# Patient Record
Sex: Male | Born: 1970
Health system: Southern US, Community
[De-identification: ages and names within clinical notes are randomized; demographics above are authoritative.]

## PROBLEM LIST (undated history)

## (undated) DIAGNOSIS — G4730 Sleep apnea, unspecified: Secondary | ICD-10-CM

## (undated) DIAGNOSIS — R7989 Other specified abnormal findings of blood chemistry: Secondary | ICD-10-CM

## (undated) DIAGNOSIS — Z8739 Personal history of other diseases of the musculoskeletal system and connective tissue: Secondary | ICD-10-CM

## (undated) DIAGNOSIS — Z8489 Family history of other specified conditions: Secondary | ICD-10-CM

## (undated) HISTORY — DX: Personal history of other diseases of the musculoskeletal system and connective tissue: Z87.39

## (undated) HISTORY — DX: Sleep apnea, unspecified: G47.30

## (undated) HISTORY — DX: Other specified abnormal findings of blood chemistry: R79.89

## (undated) HISTORY — PX: NO PAST SURGERIES: SHX2092

## (undated) HISTORY — DX: Family history of other specified conditions: Z84.89

---

## 2005-12-07 ENCOUNTER — Emergency Department: Payer: Self-pay | Admitting: Emergency Medicine

## 2010-03-22 ENCOUNTER — Ambulatory Visit: Payer: Self-pay | Admitting: Family Medicine

## 2011-06-03 IMAGING — US US EXTREM LOW VENOUS*L*
1 series · 17 of 22 positions shown · non-contrast
Comparison: none

REASON FOR EXAM: CALL REPORT [DATE] rule out dvt
COMMENTS:

[Series 1: us extrem low venous*left* · 17 of 22 slices shown]
[im 1/22]
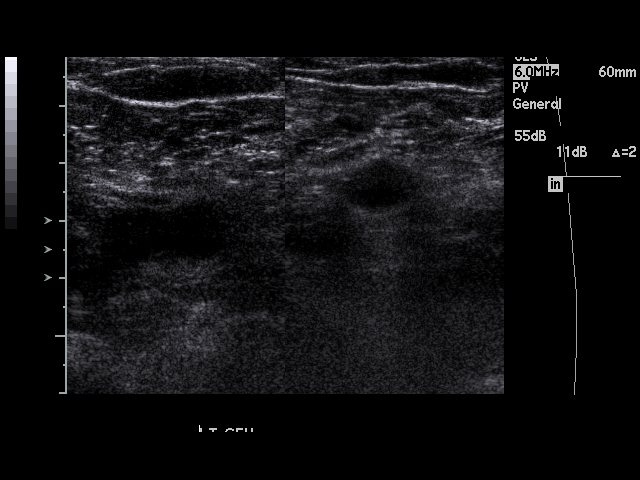
[im 2/22]
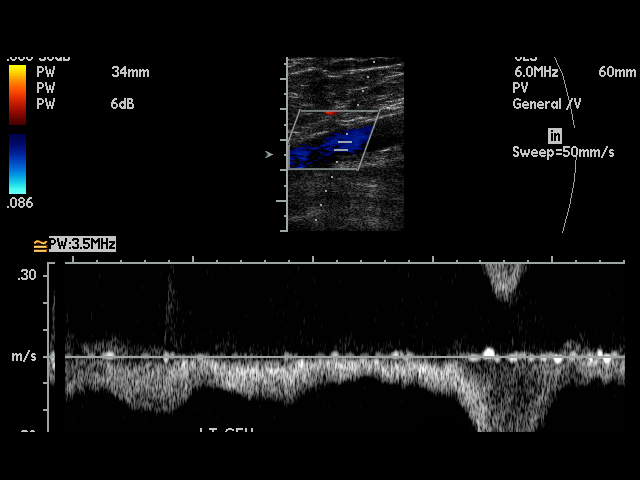
[im 4/22]
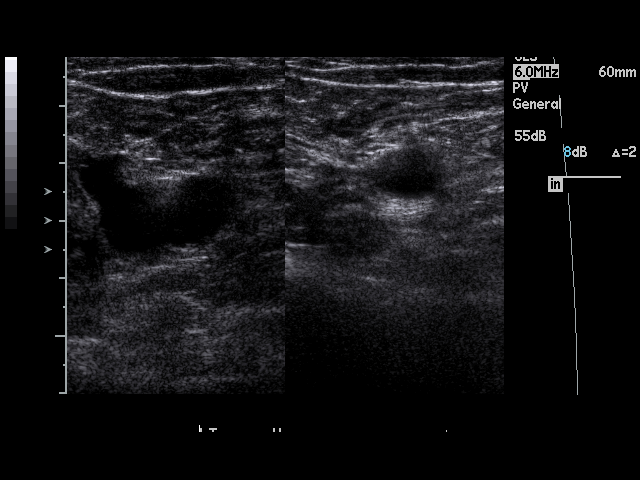
[im 5/22]
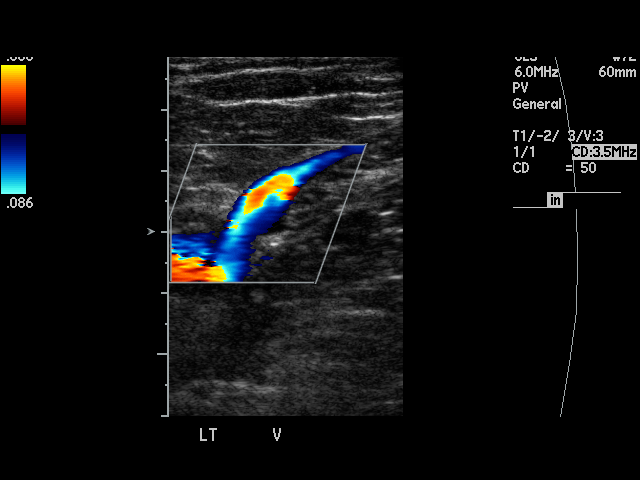
[im 6/22]
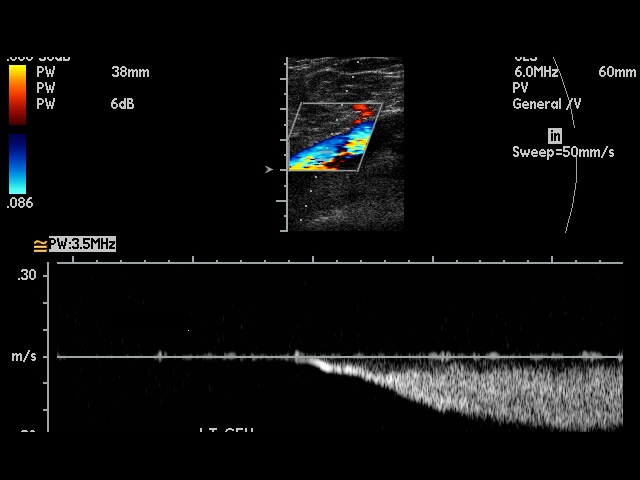
[im 8/22]
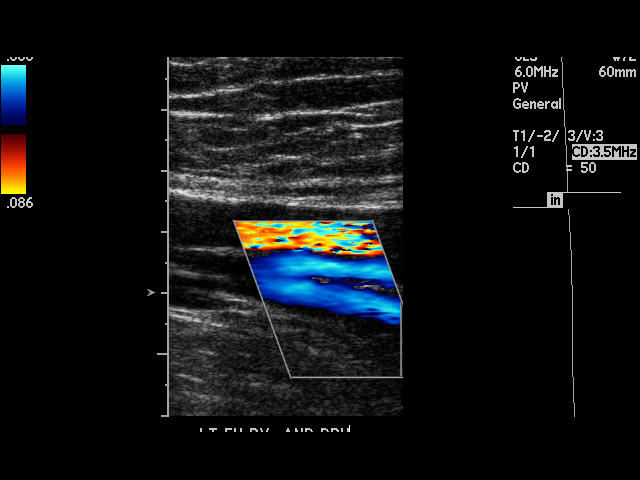
[im 9/22]
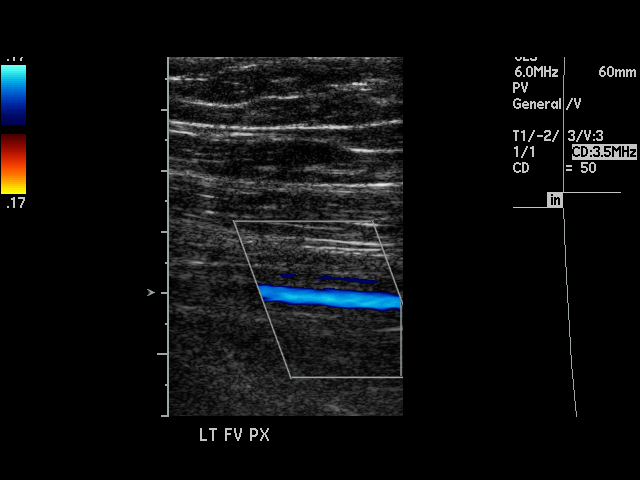
[im 10/22]
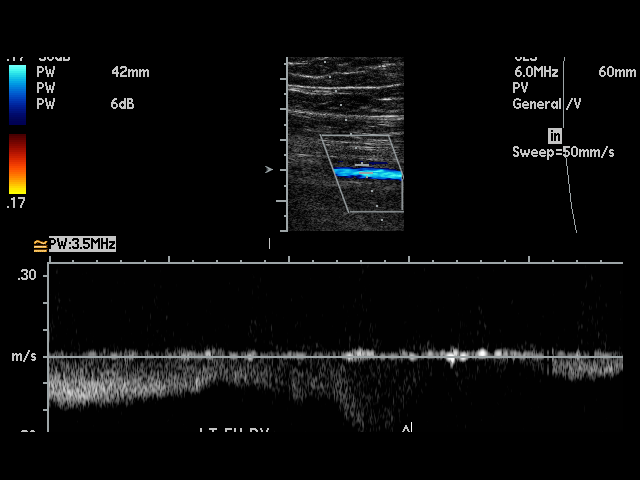
[im 12/22]
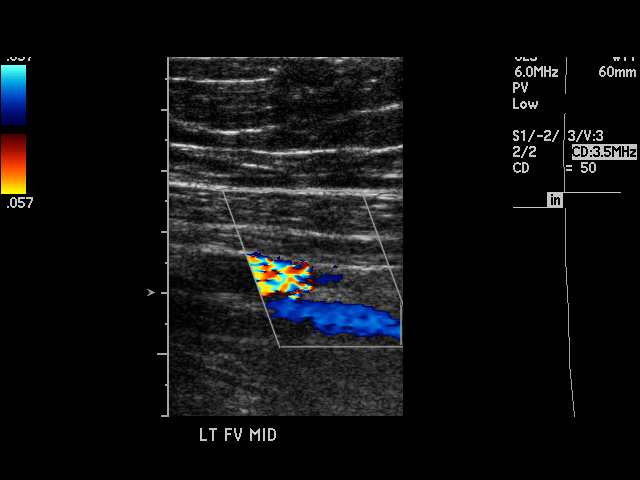
[im 13/22]
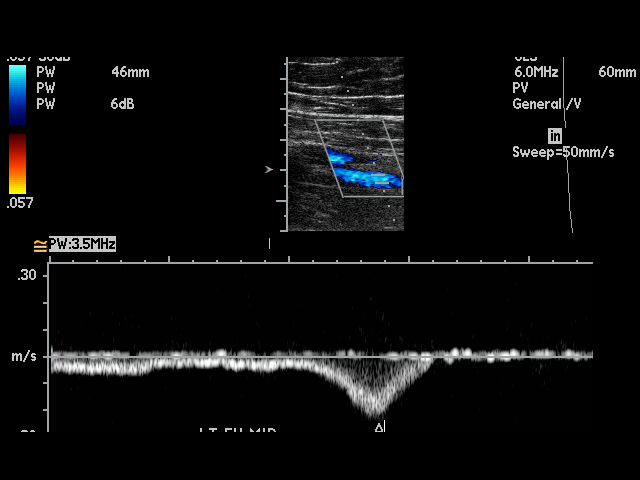
[im 14/22]
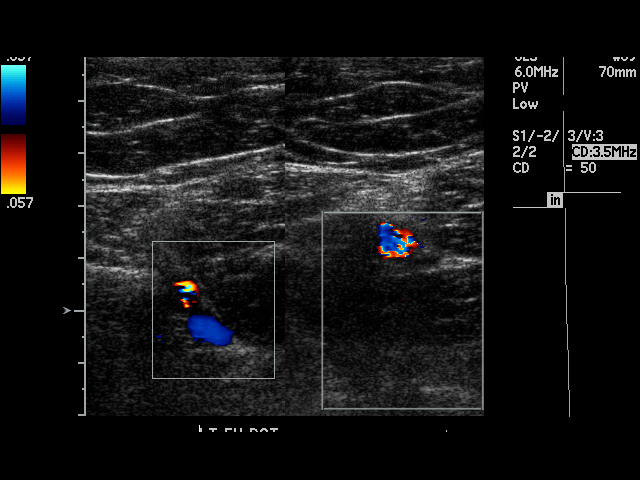
[im 15/22]
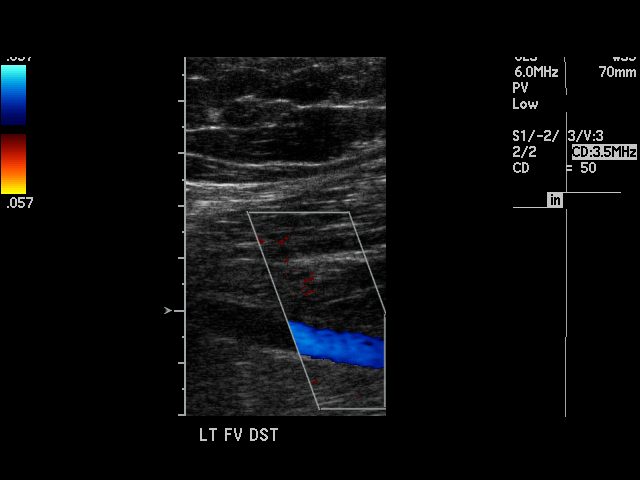
[im 17/22]
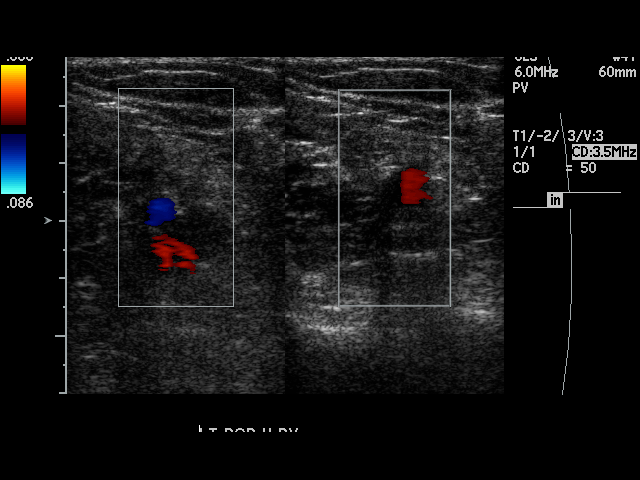
[im 18/22]
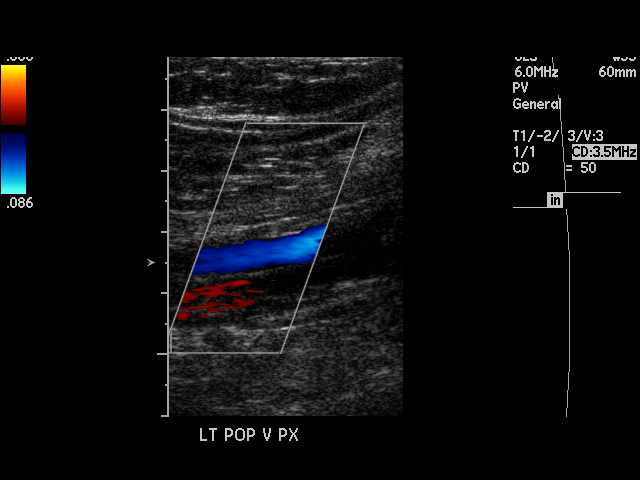
[im 19/22]
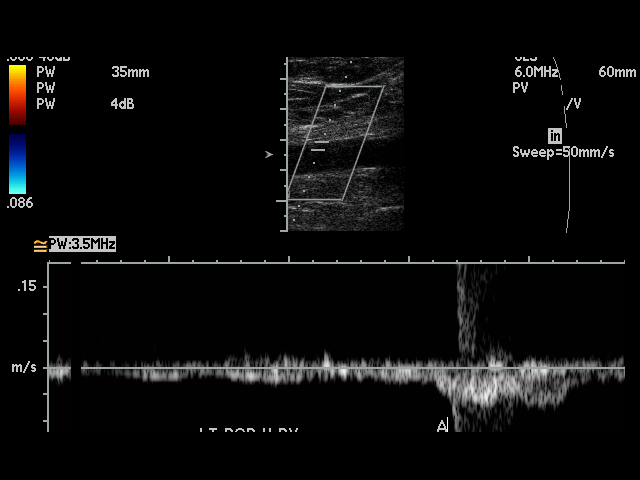
[im 21/22]
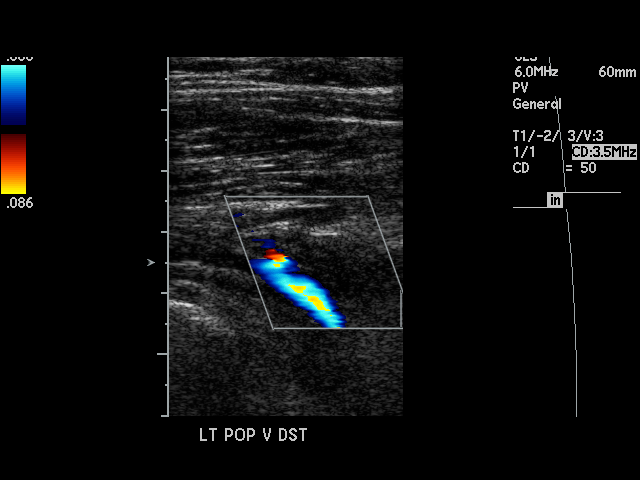
[im 22/22]
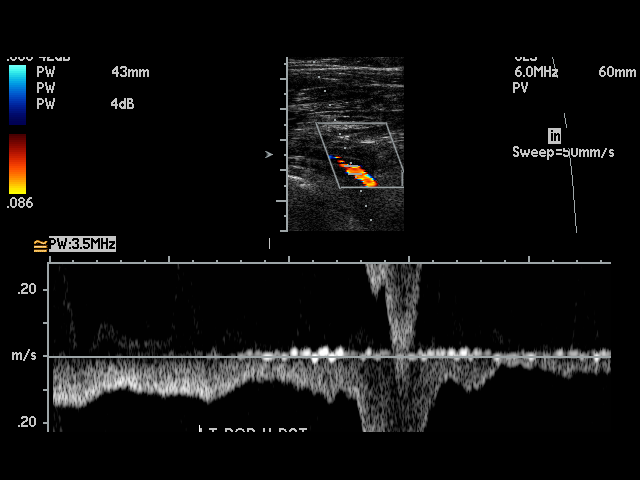

[17 of 22 positions shown; findings below may reference images not displayed]

PROCEDURE:     US  - US DOPPLER LOW EXTR LEFT  - March 22, 2010  [DATE]

RESULT:     Doppler interrogation of the deep venous system of the left leg
from the common femoral to the popliteal vein shows normal compressibility.
The color Doppler and spectral Doppler appearance is normal. There is
respiratory phasicity with increased proximal flow during calf compression.
IMPRESSION: No evidence of left lower extremity deep vein thrombosis.

## 2015-12-18 ENCOUNTER — Encounter: Payer: Self-pay | Admitting: Physician Assistant

## 2015-12-18 ENCOUNTER — Encounter: Payer: Self-pay | Admitting: Urology

## 2015-12-18 ENCOUNTER — Ambulatory Visit (INDEPENDENT_AMBULATORY_CARE_PROVIDER_SITE_OTHER): Payer: PRIVATE HEALTH INSURANCE | Admitting: Urology

## 2015-12-18 VITALS — BP 115/75 | HR 79 | Ht 76.0 in | Wt 195.6 lb

## 2015-12-18 DIAGNOSIS — N528 Other male erectile dysfunction: Secondary | ICD-10-CM

## 2015-12-18 DIAGNOSIS — E291 Testicular hypofunction: Secondary | ICD-10-CM

## 2015-12-18 DIAGNOSIS — N529 Male erectile dysfunction, unspecified: Secondary | ICD-10-CM

## 2015-12-18 NOTE — Progress Notes (Signed)
12/18/2015 3:41 PM   Robert Trevino [birth year] [id number]  Referring provider: No referring provider defined for this encounter.  Chief Complaint  Patient presents with  . Low Testosterone    referred by [name] Robert Trevino    HPI: Patient is a 45 year old Caucasian male with hypogonadism who had been receiving testosterone cypionate through his PCP's office who is referred to [place] to manage his hypogonadal issues.  He states he has been self administering testosterone since October 2016. At one point, he is found to have an elevated prolactin level and was referred to endocrinology. Endocrinology evaluated the patient and suggested he not return on to the testosterone injections.  He did not have an MRI of his pituitary gland.  Since he has been off his injections, he feels fatigued, depressed, experiencing reduced libido, moodiness, and the deterioration in his work performance.        Androgen Deficiency in the Aging Male      12/18/15 1500       Androgen Deficiency in the Aging Male   Do you have a decrease in libido (sex drive) Yes     Do you have lack of energy Yes     Do you have a decrease in strength and/or endurance Yes     Have you lost height No     Have you noticed a decreased "enjoyment of life" Yes     Are you sad and/or grumpy Yes     Are your erections less strong Yes     Have you noticed a recent deterioration in your ability to play sports Yes     Are you falling asleep after dinner No     Has there been a recent deterioration in your work performance Yes         Erectile dysfunction His SHIM score is 14, which is mild to moderate ED.   He has been having difficulty with erections for the last several months.   His major complaint is getting an erections.  His libido is diminished.   His risk factors for ED are sleep apnea (using CPAP machine) and hypogonadism .  He denies any painful erections or curvatures with his erections.         SHIM    12/18/15 1515       SHIM: Over the last 6 months:   How do you rate your confidence that you could get and keep an erection? Very Low     When you had erections with sexual stimulation, how often were your erections hard enough for penetration (entering your partner)? Sometimes (about half the time)     During sexual intercourse, how often were you able to maintain your erection after you had penetrated (entered) your partner? Difficult     During sexual intercourse, how difficult was it to maintain your erection to completion of intercourse? Slightly Difficult     When you attempted sexual intercourse, how often was it satisfactory for you? Difficult     SHIM Total Score   SHIM 14        Score: 1-7 Severe ED 8-11 Moderate ED 12-16 Mild-Moderate ED 17-21 Mild ED 22-25 No ED   He has not had any difficulty with urination at this time.  He denies gross hematuria, dysuria or suprapubic pain. He denies fevers, chills, nausea and vomiting.  PMH: Past Medical History  Diagnosis Date  . Sleep apnea   . Low testosterone     Surgical  History: Past Surgical History  Procedure Laterality Date  . None      Home Medications:    Medication List       This list is accurate as of: 12/18/15  3:41 PM.  Always use your most recent med list.               fluticasone 50 MCG/ACT nasal spray  Commonly known as:  FLONASE     testosterone cypionate 200 MG/ML injection  Commonly known as:  DEPOTESTOSTERONE CYPIONATE  Inject into the muscle. Reported on 12/18/2015        Allergies: No Known Allergies  Family History: Family History  Problem Relation Age of Onset  . Kidney disease Neg Hx   . Prostate cancer Neg Hx     Social History:  reports that he has never smoked. He does not have any smokeless tobacco history on file. He reports that he drinks alcohol. He reports that he does not use illicit drugs.  ROS: UROLOGY Frequent Urination?: No Hard to postpone urination?:  No Burning/pain with urination?: No Get up at night to urinate?: No Leakage of urine?: No Urine stream starts and stops?: No Trouble starting stream?: No Do you have to strain to urinate?: No Blood in urine?: No Urinary tract infection?: No Sexually transmitted disease?: No Injury to kidneys or bladder?: No Painful intercourse?: No Weak stream?: No Erection problems?: Yes Penile pain?: No  Gastrointestinal Nausea?: No Vomiting?: No Indigestion/heartburn?: No Diarrhea?: No Constipation?: No  Constitutional Fever: No Night sweats?: No Weight loss?: No Fatigue?: Yes  Skin Skin rash/lesions?: No Itching?: No  Eyes Blurred vision?: No Double vision?: No  Ears/Nose/Throat Sore throat?: No Sinus problems?: Yes  Hematologic/Lymphatic Swollen glands?: No Easy bruising?: No  Cardiovascular Leg swelling?: No Chest pain?: No  Respiratory Cough?: No Shortness of breath?: No  Endocrine Excessive thirst?: No  Musculoskeletal Back pain?: Yes Joint pain?: No  Neurological Headaches?: No Dizziness?: No  Psychologic Depression?: No Anxiety?: No  Physical Exam: BP 115/75 mmHg  Pulse 79  Ht  (1.93 m)  Constitutional: Well nourished. Alert and oriented, No acute distress. HEENT: [place] AT, moist mucus membranes. Trachea midline, no masses. Cardiovascular: No clubbing, cyanosis, or edema. Respiratory: Normal respiratory effort, no increased work of breathing. GI: Abdomen is soft, non tender, non distended, no abdominal masses. Liver and spleen not palpable.  No hernias appreciated.  Stool sample for occult testing is not indicated.   GU: No CVA tenderness.  No bladder fullness or masses.  Patient with circumcised phallus.   Urethral meatus is patent.  No penile discharge. No penile lesions or rashes. Scrotum without lesions, cysts, rashes and/or edema.  Testicles are located scrotally bilaterally. No masses are appreciated in the testicles. Left and right  epididymis are normal. Rectal: Patient with  normal sphincter tone. Anus and perineum without scarring or rashes. No rectal masses are appreciated. Prostate is approximately 45 grams, no nodules are appreciated. Seminal vesicles are normal. Skin: No rashes, bruises or suspicious lesions. Lymph: No cervical or inguinal adenopathy. Neurologic: Grossly intact, no focal deficits, moving all 4 extremities. Psychiatric: Normal mood and affect.   Assessment & Plan:    1. Hypogonadism:   I explained to patient that we would need 2 morning serum testosterone draws before 9 AM on 2 separate occasions returned below the laboratories parameter for normal testosterone. I have added TSH, PSA, lipids, CMP, HCT, FSH/LH and prolactin to his PCP's blood work request.  He will sign a  medical release, so that I can obtain those values.    I discussed with the patient the side effects of testosterone therapy, such as: enlargement of the prostate gland that may in turn cause LUTS, possible increased risk of PCa, DVT's and/or PE's, possible increased risk of heart attack or stroke, lower sperm count, swelling of the ankles, feet, or body, with or without heart failure, enlarged or painful breasts, have problems breathing while you sleep (sleep apnea), increased prostate specific antigen, mood swings, hypertension and increased red blood cell count.  If his testosterone levels are found low and his other blood work returns within normal parameters, he would like to restart the injections.  I did explain that our office policy is to have patient's receive the injections in our office due to the potential of abuse with testosterone products.    2. Erectile dysfunction:   SHIM score is 14.  We will readdress once the patient's gonadal state is evaluated.    Greater than 50% was spent in counseling & coordination of care with the patient.   Return for serum testosterone before 9 AM  .  These notes generated with voice  recognition software. I apologize for typographical errors.  Michiel Cowboy, PA-C  [facility] [address] [place], [place] [address] [phone]

## 2015-12-19 DIAGNOSIS — E291 Testicular hypofunction: Secondary | ICD-10-CM | POA: Insufficient documentation

## 2015-12-20 ENCOUNTER — Other Ambulatory Visit: Payer: Self-pay

## 2015-12-20 DIAGNOSIS — Z79899 Other long term (current) drug therapy: Secondary | ICD-10-CM

## 2015-12-20 DIAGNOSIS — E291 Testicular hypofunction: Secondary | ICD-10-CM

## 2015-12-20 DIAGNOSIS — N529 Male erectile dysfunction, unspecified: Secondary | ICD-10-CM | POA: Insufficient documentation

## 2015-12-21 ENCOUNTER — Encounter: Payer: Self-pay | Admitting: Physician Assistant

## 2015-12-21 ENCOUNTER — Other Ambulatory Visit: Payer: PRIVATE HEALTH INSURANCE

## 2015-12-21 DIAGNOSIS — E291 Testicular hypofunction: Secondary | ICD-10-CM

## 2015-12-22 LAB — TESTOSTERONE: Testosterone: 276 ng/dL — ABNORMAL LOW (ref 348–1197)

## 2015-12-24 ENCOUNTER — Other Ambulatory Visit: Payer: Self-pay

## 2015-12-24 ENCOUNTER — Ambulatory Visit: Payer: Self-pay | Admitting: Urology

## 2015-12-24 ENCOUNTER — Telehealth: Payer: Self-pay

## 2015-12-24 DIAGNOSIS — E291 Testicular hypofunction: Secondary | ICD-10-CM

## 2015-12-24 NOTE — Telephone Encounter (Signed)
Spoke with Dianna at [facility] and test were added.

## 2015-12-24 NOTE — Telephone Encounter (Signed)
-----   Message from Harle Battiest, PA-C sent at 12/22/2015  7:45 PM EST ----- Please add an estradiol level to his blood work.

## 2015-12-25 ENCOUNTER — Other Ambulatory Visit: Payer: PRIVATE HEALTH INSURANCE

## 2015-12-25 DIAGNOSIS — E291 Testicular hypofunction: Secondary | ICD-10-CM

## 2015-12-25 LAB — SPECIMEN STATUS REPORT

## 2015-12-25 LAB — ESTRADIOL: Estradiol: 21.5 pg/mL (ref 7.6–42.6)

## 2015-12-26 ENCOUNTER — Telehealth: Payer: Self-pay

## 2015-12-26 DIAGNOSIS — E291 Testicular hypofunction: Secondary | ICD-10-CM

## 2015-12-26 LAB — TESTOSTERONE: TESTOSTERONE: 514 ng/dL (ref 348–1197)

## 2015-12-26 NOTE — Telephone Encounter (Signed)
Spoke with pt in reference to testosterone results. Pt stated that no he has not given himself an injection. Please advise.

## 2015-12-26 NOTE — Telephone Encounter (Signed)
Spoke with pt in reference testosterone levels. Pt asked if we could check the levels again to rule out error. Pleas advise.

## 2015-12-26 NOTE — Telephone Encounter (Signed)
That is fine 

## 2015-12-26 NOTE — Telephone Encounter (Signed)
His last testosterone return at 500.  This is a normal testosterone level.  By this result, he does not have hypogonadism.

## 2015-12-26 NOTE — Telephone Encounter (Signed)
-----   Message from Harle Battiest, PA-C sent at 12/26/2015  8:49 AM EST ----- Patient's testosterone is normal.  Did he inject himself?

## 2015-12-26 NOTE — Telephone Encounter (Signed)
LMOM

## 2015-12-27 NOTE — Telephone Encounter (Signed)
Spoke with pt and made aware Carollee Herter said we can do another testosterone lab draw. Pt will RTC tomorrow morning. Lab orders placed.

## 2015-12-28 ENCOUNTER — Other Ambulatory Visit: Payer: PRIVATE HEALTH INSURANCE

## 2015-12-28 DIAGNOSIS — E291 Testicular hypofunction: Secondary | ICD-10-CM

## 2015-12-29 LAB — TESTOSTERONE: TESTOSTERONE: 258 ng/dL — AB (ref 348–1197)

## 2015-12-31 ENCOUNTER — Telehealth: Payer: Self-pay

## 2015-12-31 DIAGNOSIS — E291 Testicular hypofunction: Secondary | ICD-10-CM

## 2015-12-31 NOTE — Telephone Encounter (Signed)
-----   Message from Harle Battiest, PA-C sent at 12/31/2015 12:37 PM EST ----- I would like one more testosterone level in the am to be sure.

## 2015-12-31 NOTE — Telephone Encounter (Signed)
Spoke with pt in reference to testosterone levels. Pt agreed to another testosterone lab draw. Pt will call back to make lab appt. Orders placed.

## 2016-01-09 ENCOUNTER — Other Ambulatory Visit: Payer: PRIVATE HEALTH INSURANCE

## 2016-01-09 DIAGNOSIS — E291 Testicular hypofunction: Secondary | ICD-10-CM

## 2016-01-10 LAB — TESTOSTERONE: Testosterone: 282 ng/dL — ABNORMAL LOW (ref 348–1197)

## 2016-01-15 ENCOUNTER — Telehealth: Payer: Self-pay

## 2016-01-15 NOTE — Telephone Encounter (Signed)
-----   Message from Harle Battiest, PA-C sent at 01/10/2016  8:34 AM EST ----- Patient may start testosterone cypionate /mL, 1 mL every two weeks.  I would then like a testosterone level drawn with HCT in three months, one week after an injection.

## 2016-01-15 NOTE — Telephone Encounter (Signed)
LMOM-medication sent to pharmacy 

## 2016-01-15 NOTE — Telephone Encounter (Signed)
Spoke with pt in reference to testosterone injections. Pt will pick up medication and come Thursday for injection. On Thursday lab appt will be made.

## 2016-01-16 ENCOUNTER — Other Ambulatory Visit: Payer: Self-pay

## 2016-01-16 DIAGNOSIS — E291 Testicular hypofunction: Secondary | ICD-10-CM

## 2016-01-16 MED ORDER — TESTOSTERONE CYPIONATE 200 MG/ML IM SOLN: 200.0000 mg | INTRAMUSCULAR | Status: DC

## 2016-01-17 ENCOUNTER — Ambulatory Visit (INDEPENDENT_AMBULATORY_CARE_PROVIDER_SITE_OTHER): Payer: PRIVATE HEALTH INSURANCE

## 2016-01-17 DIAGNOSIS — E291 Testicular hypofunction: Secondary | ICD-10-CM | POA: Diagnosis not present

## 2016-01-17 MED ORDER — TESTOSTERONE CYPIONATE 200 MG/ML IM SOLN
200.0000 mg | Freq: Once | INTRAMUSCULAR | Status: AC
  Administered 2016-01-17: 200 mg via INTRAMUSCULAR

## 2016-01-17 NOTE — Progress Notes (Signed)
Testosterone IM Injection  Due to Hypogonadism patient is present today for a Testosterone Injection.  Medication: Testosterone Cypionate Dose: 1mL Location: right upper outer buttocks Lot: [id number] Exp:04/2017  Patient tolerated well, no complications were noted  Preformed by: Rupert Stacks, LPN   Follow up: 2 weeks

## 2016-01-31 ENCOUNTER — Ambulatory Visit: Payer: PRIVATE HEALTH INSURANCE

## 2016-01-31 ENCOUNTER — Ambulatory Visit (INDEPENDENT_AMBULATORY_CARE_PROVIDER_SITE_OTHER): Payer: PRIVATE HEALTH INSURANCE | Admitting: *Deleted

## 2016-01-31 DIAGNOSIS — E291 Testicular hypofunction: Secondary | ICD-10-CM

## 2016-01-31 MED ORDER — TESTOSTERONE CYPIONATE 200 MG/ML IM SOLN
200.0000 mg | Freq: Once | INTRAMUSCULAR | Status: AC
  Administered 2016-01-31: 200 mg via INTRAMUSCULAR

## 2016-01-31 NOTE — Progress Notes (Signed)
Testosterone IM Injection  Due to Hypogonadism patient is present today for a Testosterone Injection.  Medication: Testosterone Cypionate Dose: 1ml Location: left upper outer buttocks Lot: [id number] Exp:04/2017  Patient tolerated well, no complications were noted  Preformed by: Dallas Schimkeamona Souleymane Saiki CMA  Follow up: 2 weeks

## 2016-02-14 ENCOUNTER — Ambulatory Visit (INDEPENDENT_AMBULATORY_CARE_PROVIDER_SITE_OTHER): Payer: PRIVATE HEALTH INSURANCE

## 2016-02-14 DIAGNOSIS — E291 Testicular hypofunction: Secondary | ICD-10-CM | POA: Diagnosis not present

## 2016-02-14 MED ORDER — TESTOSTERONE CYPIONATE 200 MG/ML IM SOLN
200.0000 mg | Freq: Once | INTRAMUSCULAR | Status: AC
  Administered 2016-02-14: 200 mg via INTRAMUSCULAR

## 2016-02-14 NOTE — Progress Notes (Signed)
Testosterone IM Injection  Due to Hypogonadism patient is present today for a Testosterone Injection.  Medication: Testosterone Cypionate Dose: 1mL Location: right upper outer buttocks Lot: [id number] Exp:07/2017  Patient tolerated well, no complications were noted  Preformed by: Rupert Stackshelsea Watkins, LPN

## 2016-02-27 ENCOUNTER — Ambulatory Visit (INDEPENDENT_AMBULATORY_CARE_PROVIDER_SITE_OTHER): Payer: PRIVATE HEALTH INSURANCE

## 2016-02-27 DIAGNOSIS — E291 Testicular hypofunction: Secondary | ICD-10-CM | POA: Diagnosis not present

## 2016-02-27 MED ORDER — TESTOSTERONE CYPIONATE 200 MG/ML IM SOLN
200.0000 mg | Freq: Once | INTRAMUSCULAR | Status: AC
  Administered 2016-02-27: 200 mg via INTRAMUSCULAR

## 2016-02-27 NOTE — Progress Notes (Signed)
Testosterone IM Injection  Due to Hypogonadism patient is present today for a Testosterone Injection.  Medication: Testosterone Cypionate Dose: 1mL Location: left upper outer buttocks Lot: [id number] Exp:07/2017  Patient tolerated well, no complications were noted  Preformed by: Rupert Stackshelsea Watkins, LPN   Follow up: 2 weeks

## 2016-02-28 ENCOUNTER — Ambulatory Visit: Payer: PRIVATE HEALTH INSURANCE

## 2016-03-09 ENCOUNTER — Telehealth: Payer: Self-pay | Admitting: Urology

## 2016-03-09 ENCOUNTER — Other Ambulatory Visit: Payer: Self-pay | Admitting: Urology

## 2016-03-09 NOTE — Telephone Encounter (Signed)
I would like a serum testosterone level checked 1 week after his next testosterone cypionate injection on the 04/27.

## 2016-03-13 ENCOUNTER — Ambulatory Visit (INDEPENDENT_AMBULATORY_CARE_PROVIDER_SITE_OTHER): Payer: PRIVATE HEALTH INSURANCE

## 2016-03-13 DIAGNOSIS — E291 Testicular hypofunction: Secondary | ICD-10-CM | POA: Diagnosis not present

## 2016-03-13 MED ORDER — TESTOSTERONE CYPIONATE 200 MG/ML IM SOLN
200.0000 mg | Freq: Once | INTRAMUSCULAR | Status: AC
  Administered 2016-03-13: 200 mg via INTRAMUSCULAR

## 2016-03-13 NOTE — Progress Notes (Signed)
Testosterone IM Injection  Due to Hypogonadism patient is present today for a Testosterone Injection.  Medication: Testosterone Cypionate Dose: 1mL Location: right upper outer buttocks Lot: [id number] Exp:08/2017  Patient tolerated well, no complications were noted  Preformed by: Rupert Stackshelsea Meade [place]eland, LPN   Follow up: 2 weeks

## 2016-03-14 ENCOUNTER — Telehealth: Payer: Self-pay | Admitting: Urology

## 2016-03-14 NOTE — Telephone Encounter (Signed)
LMOM- needs labs next Thursday

## 2016-03-14 NOTE — Telephone Encounter (Signed)
Pt called saying his insurance carrier won't cover any of his labs due to them being coded incorrectly. He was told by his insurance carrier that the reason for the labs is "infertility" and he said that's incorrect. He wants to make sure the lab codes are changed and re-filed with his insurance. Please give him a call regarding this.  Pt's ph# [phone] Thank you.

## 2016-03-19 ENCOUNTER — Other Ambulatory Visit: Payer: Self-pay

## 2016-03-19 DIAGNOSIS — E291 Testicular hypofunction: Secondary | ICD-10-CM

## 2016-03-20 ENCOUNTER — Other Ambulatory Visit: Payer: PRIVATE HEALTH INSURANCE

## 2016-03-20 DIAGNOSIS — E291 Testicular hypofunction: Secondary | ICD-10-CM

## 2016-03-21 ENCOUNTER — Telehealth: Payer: Self-pay

## 2016-03-21 LAB — TESTOSTERONE: Testosterone: 617 ng/dL (ref 348–1197)

## 2016-03-21 LAB — HEMATOCRIT

## 2016-03-21 NOTE — Telephone Encounter (Signed)
Spoke with pt in reference to needing an office visit. Pt June lab appt was changed to an office visit.

## 2016-03-21 NOTE — Telephone Encounter (Signed)
-----   Message from Harle BattiestShannon A McGowan, PA-C sent at 03/21/2016  8:15 AM EDT ----- Patient needs an office visit with me in June.

## 2016-03-27 ENCOUNTER — Ambulatory Visit (INDEPENDENT_AMBULATORY_CARE_PROVIDER_SITE_OTHER): Payer: PRIVATE HEALTH INSURANCE

## 2016-03-27 DIAGNOSIS — E291 Testicular hypofunction: Secondary | ICD-10-CM | POA: Diagnosis not present

## 2016-03-27 MED ORDER — TESTOSTERONE CYPIONATE 200 MG/ML IM SOLN
200.0000 mg | Freq: Once | INTRAMUSCULAR | Status: AC
Start: 2016-03-27 — End: 2016-03-27
  Administered 2016-03-27: 200 mg via INTRAMUSCULAR

## 2016-03-27 NOTE — Progress Notes (Signed)
Testosterone IM Injection  Due to Hypogonadism patient is present today for a Testosterone Injection.  Medication: Testosterone Cypionate Dose: 1mL Location: left upper outer buttocks Lot: [id number] Exp:08/2017  Patient tolerated well, no complications were noted  Preformed by: Rupert Stackshelsea Lynesha Bango, LPN

## 2016-04-03 ENCOUNTER — Other Ambulatory Visit: Payer: Self-pay | Admitting: Urology

## 2016-04-08 ENCOUNTER — Ambulatory Visit (INDEPENDENT_AMBULATORY_CARE_PROVIDER_SITE_OTHER): Payer: PRIVATE HEALTH INSURANCE

## 2016-04-08 DIAGNOSIS — E291 Testicular hypofunction: Secondary | ICD-10-CM

## 2016-04-08 MED ORDER — TESTOSTERONE CYPIONATE 200 MG/ML IM SOLN
200.0000 mg | Freq: Once | INTRAMUSCULAR | Status: AC
  Administered 2016-04-08: 200 mg via INTRAMUSCULAR

## 2016-04-08 NOTE — Progress Notes (Signed)
Testosterone IM Injection  Due to Hypogonadism patient is present today for a Testosterone Injection.  Medication: Testosterone Cypionate Dose: 1mL Location: right upper outer buttocks Lot: [id number] Exp:10/2017  Patient tolerated well, no complications were noted  Preformed by: Rupert Stackshelsea Jaiquan Temme, LPN

## 2016-04-24 ENCOUNTER — Ambulatory Visit (INDEPENDENT_AMBULATORY_CARE_PROVIDER_SITE_OTHER): Payer: PRIVATE HEALTH INSURANCE

## 2016-04-24 DIAGNOSIS — E291 Testicular hypofunction: Secondary | ICD-10-CM | POA: Diagnosis not present

## 2016-04-24 MED ORDER — TESTOSTERONE CYPIONATE 200 MG/ML IM SOLN
200.0000 mg | Freq: Once | INTRAMUSCULAR | Status: AC
  Administered 2016-04-24: 200 mg via INTRAMUSCULAR

## 2016-04-24 NOTE — Progress Notes (Signed)
Testosterone IM Injection  Due to Hypogonadism patient is present today for a Testosterone Injection.  Medication: Testosterone Cypionate Dose: 1mL Location: left upper outer buttocks Lot: [id number] Exp:10/2017  Patient tolerated well, no complications were noted  Preformed by: Herma Uballe, LPN     

## 2016-05-01 ENCOUNTER — Ambulatory Visit (INDEPENDENT_AMBULATORY_CARE_PROVIDER_SITE_OTHER): Payer: PRIVATE HEALTH INSURANCE | Admitting: Urology

## 2016-05-01 ENCOUNTER — Encounter: Payer: Self-pay | Admitting: Urology

## 2016-05-01 ENCOUNTER — Other Ambulatory Visit: Payer: PRIVATE HEALTH INSURANCE

## 2016-05-01 VITALS — BP 129/73 | HR 76 | Ht 76.0 in | Wt 290.0 lb

## 2016-05-01 DIAGNOSIS — N528 Other male erectile dysfunction: Secondary | ICD-10-CM | POA: Diagnosis not present

## 2016-05-01 DIAGNOSIS — E291 Testicular hypofunction: Secondary | ICD-10-CM

## 2016-05-01 DIAGNOSIS — N529 Male erectile dysfunction, unspecified: Secondary | ICD-10-CM

## 2016-05-01 DIAGNOSIS — N401 Enlarged prostate with lower urinary tract symptoms: Secondary | ICD-10-CM | POA: Diagnosis not present

## 2016-05-01 DIAGNOSIS — N138 Other obstructive and reflux uropathy: Secondary | ICD-10-CM

## 2016-05-01 NOTE — Progress Notes (Signed)
4:32 PM   LYTLE MALBURG Jul 22, 1971 [id number]  Referring provider: Geoffery Lyons, PA [address] [place], [place] [address]  Chief Complaint  Patient presents with  . Hypogonadism    HPI: Patient is a 45 year old Caucasian male with hypogonadism and erectile dysfunction who presents today for a 6 months office visit.    Hypogonadism Patient is not experiencing a decrease in libido, a lack of energy, a decrease in strength, a loss in height, a decreased enjoyment in life, sadness and/or grumpiness, erections being less strong, a recent deterioration in an ability to play sports, falling asleep after dinner and a recent deterioration in their work performance.   This is indicated by his responses to the ADAM questionnaire.  He is currently managing his hypogonadism with testosterone cypionate 200 mg IM every two weeks.   His most recent testosterone was 617 ng/dL on 24/40/1027.        Androgen Deficiency in the Aging Male      05/01/16 1600       Androgen Deficiency in the Aging Male   Do you have a decrease in libido (sex drive) No     Do you have lack of energy No     Do you have a decrease in strength and/or endurance No     Have you lost height No     Have you noticed a decreased "enjoyment of life" No     Are you sad and/or grumpy Yes     Are your erections less strong No     Have you noticed a recent deterioration in your ability to play sports No     Are you falling asleep after dinner No     Has there been a recent deterioration in your work performance No        Erectile dysfunction His SHIM score is 22, which is mild to no ED.   He has been having difficulty with erections for the last several months.   His major complaint is getting an erections.  His libido is improved.   His risk factors for ED are sleep apnea (using CPAP machine) and hypogonadism .  He denies any painful erections or curvatures with his erections.         SHIM      05/01/16 1620       SHIM: Over the last 6 months:   How do you rate your confidence that you could get and keep an erection? High     When you had erections with sexual stimulation, how often were your erections hard enough for penetration (entering your partner)? Most Times (much more than half the time)     During sexual intercourse, how often were you able to maintain your erection after you had penetrated (entered) your partner? Slightly Difficult     During sexual intercourse, how difficult was it to maintain your erection to completion of intercourse? Not Difficult     When you attempted sexual intercourse, how often was it satisfactory for you? Not Difficult     SHIM Total Score   SHIM 22        Score: 1-7 Severe ED 8-11 Moderate ED 12-16 Mild-Moderate ED 17-21 Mild ED 22-25 No ED   BPH WITH LUTS His IPSS score today is 1, which is mild lower urinary tract symptomatology. He is pleased with his quality life due to his urinary symptoms.  He denies any dysuria, hematuria or suprapubic pain.   He  also denies any recent fevers, chills, nausea or vomiting.  He does not have a family history of PCa.      IPSS      05/01/16 1600       International Prostate Symptom Score   How often have you had the sensation of not emptying your bladder? Not at All     How often have you had to urinate less than every two hours? Not at All     How often have you found you stopped and started again several times when you urinated? Not at All     How often have you found it difficult to postpone urination? Not at All     How often have you had a weak urinary stream? Not at All     How often have you had to strain to start urination? Not at All     How many times did you typically get up at night to urinate? 1 Time     Total IPSS Score 1     Quality of Life due to urinary symptoms   If you were to spend the rest of your life with your urinary condition just the way it is now how would you feel about that? Pleased          Score:  1-7 Mild 8-19 Moderate 20-35 Severe   PMH: Past Medical History  Diagnosis Date  . Sleep apnea   . Low testosterone     Surgical History: Past Surgical History  Procedure Laterality Date  . None      Home Medications:    Medication List       This list is accurate as of: 05/01/16  4:32 PM.  Always use your most recent med list.               fluticasone 50 MCG/ACT nasal spray  Commonly known as:  FLONASE     testosterone cypionate 200 MG/ML injection  Commonly known as:  DEPOTESTOSTERONE CYPIONATE  INJECT 1 MILLILITER INTO THE MUSCLE EVERY 14 DAYS        Allergies: No Known Allergies  Family History: Family History  Problem Relation Age of Onset  . Kidney disease Neg Hx   . Prostate cancer Neg Hx     Social History:  reports that he has never smoked. He does not have any smokeless tobacco history on file. He reports that he drinks alcohol. He reports that he does not use illicit drugs.  ROS: UROLOGY Frequent Urination?: No Hard to postpone urination?: No Burning/pain with urination?: No Get up at night to urinate?: No Leakage of urine?: No Urine stream starts and stops?: No Trouble starting stream?: No Do you have to strain to urinate?: No Blood in urine?: No Urinary tract infection?: No Sexually transmitted disease?: No Injury to kidneys or bladder?: No Painful intercourse?: No Weak stream?: No Erection problems?: No Penile pain?: No  Gastrointestinal Nausea?: No Vomiting?: No Indigestion/heartburn?: No Diarrhea?: No Constipation?: No  Constitutional Fever: No Night sweats?: No Weight loss?: No Fatigue?: No  Skin Skin rash/lesions?: No Itching?: No  Eyes Blurred vision?: No Double vision?: No  Ears/Nose/Throat Sore throat?: No Sinus problems?: No  Hematologic/Lymphatic Swollen glands?: No Easy bruising?: No  Cardiovascular Leg swelling?: No Chest pain?: No  Respiratory Cough?: No Shortness of  breath?: No  Endocrine Excessive thirst?: No  Musculoskeletal Back pain?: Yes Joint pain?: No  Neurological Headaches?: No Dizziness?: No  Psychologic Depression?: No Anxiety?: No  Physical  Exam: BP 129/73 mmHg  Pulse 76  Ht  (1.93 m)  Wt 290 lb (131.543 kg)  BMI 35.31 kg/m2  Constitutional: Well nourished. Alert and oriented, No acute distress. HEENT: [place] AT, moist mucus membranes. Trachea midline, no masses. Cardiovascular: No clubbing, cyanosis, or edema. Respiratory: Normal respiratory effort, no increased work of breathing. GI: Abdomen is soft, non tender, non distended, no abdominal masses. Liver and spleen not palpable.  No hernias appreciated.  Stool sample for occult testing is not indicated.   GU: No CVA tenderness.  No bladder fullness or masses.  Patient with circumcised phallus.   Urethral meatus is patent.  No penile discharge. No penile lesions or rashes. Scrotum without lesions, cysts, rashes and/or edema.  Testicles are located scrotally bilaterally. No masses are appreciated in the testicles. Left and right epididymis are normal. Rectal: Patient with  normal sphincter tone. Anus and perineum without scarring or rashes. No rectal masses are appreciated. Prostate is approximately 45 grams, no nodules are appreciated. Seminal vesicles are normal. Skin: No rashes, bruises or suspicious lesions. Lymph: No cervical or inguinal adenopathy. Neurologic: Grossly intact, no focal deficits, moving all 4 extremities. Psychiatric: Normal mood and affect.   Assessment & Plan:    1. Hypogonadism:   Patient is doing well with the testosterone cypionate injections.  He would like to continue.  HCT is drawn today.  He will RTC next week for his injection.  He will need a HCT and a serum testosterone level drawn one week after an injection in three months.  He will need to see a provider in 6 months for ADAM, HCT and testosterone.  2. Erectile dysfunction:   SHIM score is  22.  We will continue to monitor.  He will RTC in 6 months for SHIM score and exam.  3. IPSS score is 1/1.  We will continue to monitor, as testosterone therapy can worsen LUTS.    He will have his IPSS score, exam and PSA in 6 months.   Return in about 1 week (around 05/08/2016) for moving shots to Wednesday.  These notes generated with voice recognition software. I apologize for typographical errors.  Michiel Cowboy, PA-C  [facility] [address] [place], [place] [address] [phone]

## 2016-05-02 ENCOUNTER — Telehealth: Payer: Self-pay

## 2016-05-02 LAB — HEMATOCRIT: HEMATOCRIT: 46.4 % (ref 37.5–51.0)

## 2016-05-02 LAB — PSA: PROSTATE SPECIFIC AG, SERUM: 0.7 ng/mL (ref 0.0–4.0)

## 2016-05-02 NOTE — Telephone Encounter (Signed)
LMOM- most recent labs are good.

## 2016-05-02 NOTE — Telephone Encounter (Signed)
-----   Message from Harle BattiestShannon A McGowan, PA-C sent at 05/02/2016  7:54 AM EDT ----- Labs are good.

## 2016-05-07 ENCOUNTER — Ambulatory Visit (INDEPENDENT_AMBULATORY_CARE_PROVIDER_SITE_OTHER): Payer: PRIVATE HEALTH INSURANCE

## 2016-05-07 DIAGNOSIS — E291 Testicular hypofunction: Secondary | ICD-10-CM | POA: Diagnosis not present

## 2016-05-07 MED ORDER — TESTOSTERONE CYPIONATE 200 MG/ML IM SOLN
200.0000 mg | Freq: Once | INTRAMUSCULAR | Status: AC
  Administered 2016-05-07: 200 mg via INTRAMUSCULAR

## 2016-05-07 NOTE — Progress Notes (Signed)
Testosterone IM Injection  Due to Hypogonadism patient is present today for a Testosterone Injection.  Medication: Testosterone Cypionate Dose: 1mL Location: right upper outer buttocks Lot: [id number] Exp:10/2017  Patient tolerated well, no complications were noted  Preformed by: Rupert Stackshelsea Nashaun Hillmer, LPN

## 2016-05-08 ENCOUNTER — Ambulatory Visit: Payer: PRIVATE HEALTH INSURANCE

## 2016-05-21 ENCOUNTER — Ambulatory Visit (INDEPENDENT_AMBULATORY_CARE_PROVIDER_SITE_OTHER): Payer: PRIVATE HEALTH INSURANCE

## 2016-05-21 DIAGNOSIS — E291 Testicular hypofunction: Secondary | ICD-10-CM | POA: Diagnosis not present

## 2016-05-21 MED ORDER — TESTOSTERONE CYPIONATE 200 MG/ML IM SOLN
200.0000 mg | Freq: Once | INTRAMUSCULAR | Status: AC
  Administered 2016-05-21: 200 mg via INTRAMUSCULAR

## 2016-05-21 NOTE — Progress Notes (Signed)
Testosterone IM Injection  Due to Hypogonadism patient is present today for a Testosterone Injection.  Medication: Testosterone Cypionate Dose: 1mL Location: left upper outer buttocks Lot: [id number] Exp:10/2017  Patient tolerated well, no complications were noted  Preformed by: Rupert Stackshelsea Kinzleigh Kandler, LPN

## 2016-06-04 ENCOUNTER — Ambulatory Visit: Payer: PRIVATE HEALTH INSURANCE

## 2016-06-05 ENCOUNTER — Ambulatory Visit (INDEPENDENT_AMBULATORY_CARE_PROVIDER_SITE_OTHER): Payer: PRIVATE HEALTH INSURANCE

## 2016-06-05 DIAGNOSIS — E291 Testicular hypofunction: Secondary | ICD-10-CM | POA: Diagnosis not present

## 2016-06-05 MED ORDER — TESTOSTERONE CYPIONATE 200 MG/ML IM SOLN
200.0000 mg | Freq: Once | INTRAMUSCULAR | Status: AC
  Administered 2016-06-05: 200 mg via INTRAMUSCULAR

## 2016-06-05 NOTE — Progress Notes (Signed)
Testosterone IM Injection  Due to Hypogonadism patient is present today for a Testosterone Injection.  Medication: Testosterone Cypionate Dose: 1mL Location: left upper outer buttocks Lot: [id number] Exp:12/2017  Patient tolerated well, no complications were noted  Preformed by: Rupert Stackshelsea Luv Mish, LPN   Follow up: 2 weeks

## 2016-06-06 ENCOUNTER — Ambulatory Visit: Payer: PRIVATE HEALTH INSURANCE

## 2016-06-18 ENCOUNTER — Ambulatory Visit (INDEPENDENT_AMBULATORY_CARE_PROVIDER_SITE_OTHER): Payer: PRIVATE HEALTH INSURANCE | Admitting: Family Medicine

## 2016-06-18 DIAGNOSIS — E291 Testicular hypofunction: Secondary | ICD-10-CM

## 2016-06-18 MED ORDER — TESTOSTERONE CYPIONATE 200 MG/ML IM SOLN
200.0000 mg | Freq: Once | INTRAMUSCULAR | Status: AC
  Administered 2016-06-18: 200 mg via INTRAMUSCULAR

## 2016-06-18 NOTE — Progress Notes (Signed)
Testosterone IM Injection  Due to Hypogonadism patient is present today for a Testosterone Injection.  Medication: Testosterone Cypionate Dose: Location: right upper outer buttocks Lot: [id number] Exp:12/2017  Patient tolerated well, no complications were noted  Preformed by: Teressa Lower, CMA

## 2016-06-21 ENCOUNTER — Emergency Department
Admission: EM | Admit: 2016-06-21 | Discharge: 2016-06-22 | Disposition: A | Discharge status: Home / Self Care | Payer: PRIVATE HEALTH INSURANCE | Attending: Emergency Medicine | Admitting: Emergency Medicine

## 2016-06-21 ENCOUNTER — Encounter: Payer: Self-pay | Admitting: Emergency Medicine

## 2016-06-21 DIAGNOSIS — R319 Hematuria, unspecified: Secondary | ICD-10-CM | POA: Diagnosis present

## 2016-06-21 DIAGNOSIS — N390 Urinary tract infection, site not specified: Secondary | ICD-10-CM

## 2016-06-21 DIAGNOSIS — Z7951 Long term (current) use of inhaled steroids: Secondary | ICD-10-CM | POA: Insufficient documentation

## 2016-06-21 NOTE — ED Triage Notes (Signed)
Pt says since 6pm he's been noticing blood in his urine with burning; denies back pain; pt in no distress in triage

## 2016-06-21 NOTE — ED Notes (Signed)
FIRST NURSE NOTE: Patient presents with c/o gross hematuria since 1800. Denies significant pain, however reports that it does "burn" some when he urinates. NOS reported at this time.

## 2016-06-21 NOTE — ED Notes (Signed)
Pt reports frequent, painful urination since 6 pm. Urine bloody with clots. Denies hx of UTI and kidney stones.

## 2016-06-22 LAB — URINALYSIS COMPLETE WITH MICROSCOPIC (ARMC ONLY)
BACTERIA UA: NONE SEEN
SQUAMOUS EPITHELIAL / LPF: NONE SEEN
Specific Gravity, Urine: 1.02 (ref 1.005–1.030)

## 2016-06-22 MED ORDER — PHENAZOPYRIDINE HCL 200 MG PO TABS: 200.0000 mg | ORAL_TABLET | Freq: Three times a day (TID) | ORAL | 0 refills | Status: DC | PRN

## 2016-06-22 MED ORDER — CEPHALEXIN 500 MG PO CAPS
500.0000 mg | ORAL_CAPSULE | Freq: Once | ORAL | Status: AC
  Administered 2016-06-22: 500 mg via ORAL
  Filled 2016-06-22: qty 1

## 2016-06-22 MED ORDER — PHENAZOPYRIDINE HCL 200 MG PO TABS
200.0000 mg | ORAL_TABLET | Freq: Once | ORAL | Status: AC
  Administered 2016-06-22: 200 mg via ORAL
  Filled 2016-06-22: qty 1

## 2016-06-22 MED ORDER — CEPHALEXIN 500 MG PO CAPS: 500.0000 mg | ORAL_CAPSULE | Freq: Three times a day (TID) | ORAL | 0 refills | Status: DC

## 2016-06-22 NOTE — ED Provider Notes (Signed)
[facility] Emergency Department Provider Note   ____________________________________________   First MD Initiated Contact with Patient 06/22/16 0018     (approximate)  I have reviewed the triage vital signs and the nursing notes.   HISTORY  Chief Complaint Hematuria and Dysuria    HPI Robert Trevino is a 45 y.o. male who presents to the ED from home with a chief complain of hematuria. Patient has a history of hypogonadism on testosterone injections who states he has been urinating blood since 6 PM. Symptoms associated with burning on urination and urinary frequency. Denies fever, chills, lightheadedness, chest pain, shortness of breath, flank/back pain, abdominal pain, nausea, vomiting, diarrhea, testicular pain or swelling. Nothing makes his symptoms better or worse. Patient denies recent travel or trauma. Denies use of anticoagulants. Denies concern for STDs.   Past Medical History:  Diagnosis Date  . Low testosterone   . Sleep apnea     Patient Active Problem List   Diagnosis Date Noted  . Erectile dysfunction of organic origin 12/20/2015  . Hypogonadism in male 12/19/2015    Past Surgical History:  Procedure Laterality Date  . none      Prior to Admission medications   Medication Sig Start Date End Date Taking? Authorizing Provider  cephALEXin (KEFLEX) 500 MG capsule Take 1 capsule (500 mg total) by mouth 3 (three) times daily. 06/22/16   Irean HongJade J Mikela Senn, MD  fluticasone Aleda Grana(FLONASE) 50 MCG/ACT nasal spray  01/15/10   Historical Provider, MD  phenazopyridine (PYRIDIUM) 200 MG tablet Take 1 tablet (200 mg total) by mouth 3 (three) times daily as needed for pain. 06/22/16   Irean HongJade J Marilyn Nihiser, MD  testosterone cypionate (DEPOTESTOSTERONE CYPIONATE) 200 MG/ML injection INJECT 1 MILLILITER INTO THE MUSCLE EVERY 14 DAYS 04/03/16   Harle BattiestShannon A McGowan, PA-C    Allergies Review of patient's allergies indicates no known allergies.  Family History  Problem Relation  Age of Onset  . Kidney disease Neg Hx   . Prostate cancer Neg Hx     Social History Social History  Substance Use Topics  . Smoking status: Never Smoker  . Smokeless tobacco: Never Used  . Alcohol use 0.0 oz/week     Comment: 1 per day    Review of Systems  Constitutional: No fever/chills. Eyes: No visual changes. ENT: No sore throat. Cardiovascular: Denies chest pain. Respiratory: Denies shortness of breath. Gastrointestinal: No abdominal pain.  No nausea, no vomiting.  No diarrhea.  No constipation. Genitourinary: Positive for hematuria and dysuria. Musculoskeletal: Negative for back pain. Skin: Negative for rash. Neurological: Negative for headaches, focal weakness or numbness.  10-point ROS otherwise negative.  ____________________________________________   PHYSICAL EXAM:  VITAL SIGNS: ED Triage Vitals  Enc Vitals Group     BP 06/21/16 2321 136/78     Pulse Rate 06/21/16 2321 88     Resp 06/21/16 2321 18     Temp 06/21/16 2321 98.4 F (36.9 C)     Temp Source 06/21/16 2321 Oral     SpO2 06/21/16 2321 97 %     Weight 06/21/16 2321 290 lb (131.5 kg)     Height 06/21/16 2321 6\' 4"  (1.93 m)     Head Circumference --      Peak Flow --      Pain Score 06/21/16 2322 0     Pain Loc --      Pain Edu? --      Excl. in GC? --     Constitutional:  Alert and oriented. Well appearing and in no acute distress. Eyes: Conjunctivae are normal. PERRL. EOMI. Head: Atraumatic. Nose: No congestion/rhinnorhea. Mouth/Throat: Mucous membranes are moist.  Oropharynx non-erythematous. Neck: No stridor.   Cardiovascular: Normal rate, regular rhythm. Grossly normal heart sounds.  Good peripheral circulation. Respiratory: Normal respiratory effort.  No retractions. Lungs CTAB. Gastrointestinal: Soft and nontender. No distention. No abdominal bruits. No CVA tenderness. Musculoskeletal: No lower extremity tenderness nor edema.  No joint effusions. Neurologic:  Normal speech and  language. No gross focal neurologic deficits are appreciated. No gait instability. Skin:  Skin is warm, dry and intact. No rash noted. Psychiatric: Mood and affect are normal. Speech and behavior are normal.  ____________________________________________   LABS (all labs ordered are listed, but only abnormal results are displayed)  Labs Reviewed  URINALYSIS COMPLETEWITH MICROSCOPIC ([facility] ONLY) - Abnormal; Notable for the following:       Result Value   Color, Urine RED (*)    APPearance CLOUDY (*)    Glucose, UA   (*)    Value: TEST NOT REPORTED DUE TO COLOR INTERFERENCE OF URINE PIGMENT   Bilirubin Urine   (*)    Value: TEST NOT REPORTED DUE TO COLOR INTERFERENCE OF URINE PIGMENT   Ketones, ur   (*)    Value: TEST NOT REPORTED DUE TO COLOR INTERFERENCE OF URINE PIGMENT   Hgb urine dipstick   (*)    Value: TEST NOT REPORTED DUE TO COLOR INTERFERENCE OF URINE PIGMENT   Protein, ur   (*)    Value: TEST NOT REPORTED DUE TO COLOR INTERFERENCE OF URINE PIGMENT   Nitrite   (*)    Value: TEST NOT REPORTED DUE TO COLOR INTERFERENCE OF URINE PIGMENT   Leukocytes, UA   (*)    Value: TEST NOT REPORTED DUE TO COLOR INTERFERENCE OF URINE PIGMENT   All other components within normal limits  URINE CULTURE   ____________________________________________  EKG  None ____________________________________________  RADIOLOGY  None ____________________________________________   PROCEDURES  Procedure(s) performed: None  Procedures  Critical Care performed: No  ____________________________________________   INITIAL IMPRESSION / ASSESSMENT AND PLAN / ED COURSE  Pertinent labs & imaging results that were available during my care of the patient were reviewed by me and considered in my medical decision making (see chart for details).  45 year old male who presents with hematuria, dysuria and frequency. Color interference on urinalysis but it does have TNTC RBC as well as WBC. Will treat  with keflex, pyridium and patient already has an appointment with his PCP on Monday for physical exam. Return precautions given. Patient verbalizes understanding and agrees with plan of care.  Clinical Course     ____________________________________________   FINAL CLINICAL IMPRESSION(S) / ED DIAGNOSES  Final diagnoses:  UTI (lower urinary tract infection)  Hematuria      NEW MEDICATIONS STARTED DURING THIS VISIT:  New Prescriptions   CEPHALEXIN (KEFLEX) 500 MG CAPSULE    Take 1 capsule (500 mg total) by mouth 3 (three) times daily.   PHENAZOPYRIDINE (PYRIDIUM) 200 MG TABLET    Take 1 tablet (200 mg total) by mouth 3 (three) times daily as needed for pain.     Note:  This document was prepared using Dragon voice recognition software and may include unintentional dictation errors.    Irean Hong, MD 06/22/16 [phone]

## 2016-06-22 NOTE — Discharge Instructions (Signed)
1. Take antibiotic as prescribed (Keflex 500 mg 3 times daily 7 days). 2. Take urinary analgesic as prescribed (Pyridium #6). 3. Return to the ER for worsening symptoms, persistent vomiting, fever, unable to urinate or other concerns.

## 2016-06-24 LAB — URINE CULTURE
Culture: >=100000 — AB
SPECIAL REQUESTS: NORMAL

## 2016-07-01 ENCOUNTER — Ambulatory Visit (INDEPENDENT_AMBULATORY_CARE_PROVIDER_SITE_OTHER): Payer: PRIVATE HEALTH INSURANCE

## 2016-07-01 DIAGNOSIS — E291 Testicular hypofunction: Secondary | ICD-10-CM | POA: Diagnosis not present

## 2016-07-01 MED ORDER — TESTOSTERONE CYPIONATE 200 MG/ML IM SOLN
200.0000 mg | Freq: Once | INTRAMUSCULAR | Status: AC
  Administered 2016-07-01: 200 mg via INTRAMUSCULAR

## 2016-07-01 NOTE — Progress Notes (Signed)
Testosterone IM Injection  Due to Hypogonadism patient is present today for a Testosterone Injection.  Medication: Testosterone Cypionate Dose: 1mL Location: left upper outer buttocks Lot: [id number] Exp:12/2017  Patient tolerated well, no complications were noted  Preformed by: Elner Seifert, LPN   Follow up: 2 weeks   

## 2016-07-16 ENCOUNTER — Ambulatory Visit (INDEPENDENT_AMBULATORY_CARE_PROVIDER_SITE_OTHER): Payer: PRIVATE HEALTH INSURANCE | Admitting: *Deleted

## 2016-07-16 DIAGNOSIS — E291 Testicular hypofunction: Secondary | ICD-10-CM | POA: Diagnosis not present

## 2016-07-16 MED ORDER — TESTOSTERONE CYPIONATE 200 MG/ML IM SOLN
200.0000 mg | Freq: Once | INTRAMUSCULAR | Status: AC
  Administered 2016-07-16: 200 mg via INTRAMUSCULAR

## 2016-07-16 NOTE — Progress Notes (Signed)
Testosterone IM Injection  Due to Hypogonadism patient is present today for a Testosterone Injection.  Medication: Testosterone Cypionate Dose: 1 ml 200 mg Location: right upper outer buttocks Lot: [id number] Exp:12/2017  Patient tolerated well, no complications were noted  Preformed by: Dallas Schimkeamona Takeya Marquis CMA  Follow up: 2 weeks

## 2016-07-25 ENCOUNTER — Other Ambulatory Visit: Payer: Self-pay

## 2016-07-25 DIAGNOSIS — E291 Testicular hypofunction: Secondary | ICD-10-CM

## 2016-07-30 ENCOUNTER — Ambulatory Visit (INDEPENDENT_AMBULATORY_CARE_PROVIDER_SITE_OTHER): Payer: PRIVATE HEALTH INSURANCE | Admitting: Family Medicine

## 2016-07-30 DIAGNOSIS — E291 Testicular hypofunction: Secondary | ICD-10-CM | POA: Diagnosis not present

## 2016-07-30 MED ORDER — TESTOSTERONE CYPIONATE 200 MG/ML IM SOLN
200.0000 mg | Freq: Once | INTRAMUSCULAR | Status: AC
  Administered 2016-07-30: 200 mg via INTRAMUSCULAR

## 2016-07-30 NOTE — Progress Notes (Signed)
Testosterone IM Injection  Due to Hypogonadism patient is present today for a Testosterone Injection.  Medication: Testosterone Cypionate Dose: 1ML Location: left upper outer buttocks Lot: [id number] Exp:12/2017  Patient tolerated well, no complications were noted  Preformed by: Teressa Lowerarrie Garrison, CMA  Follow up: 2 weeks

## 2016-08-01 ENCOUNTER — Other Ambulatory Visit: Payer: PRIVATE HEALTH INSURANCE

## 2016-08-01 DIAGNOSIS — E291 Testicular hypofunction: Secondary | ICD-10-CM

## 2016-08-02 LAB — TESTOSTERONE: Testosterone: 800 ng/dL (ref 264–916)

## 2016-08-02 LAB — HEMATOCRIT: Hematocrit: 50.7 % (ref 37.5–51.0)

## 2016-08-04 ENCOUNTER — Telehealth: Payer: Self-pay

## 2016-08-04 DIAGNOSIS — R310 Gross hematuria: Secondary | ICD-10-CM

## 2016-08-04 NOTE — Telephone Encounter (Signed)
-----   Message from Harle BattiestShannon A McGowan, PA-C sent at 08/02/2016  9:27 PM EDT ----- Patient's labs are normal.  He was seen in the ED on 06/22/2016 for hematuria.  We need to check an UA when he comes in for his next injection.

## 2016-08-04 NOTE — Telephone Encounter (Signed)
Spoke with pt in reference to lab results and needing a u/a at next injection visit. Pt voiced understanding.

## 2016-08-14 ENCOUNTER — Ambulatory Visit (INDEPENDENT_AMBULATORY_CARE_PROVIDER_SITE_OTHER): Payer: PRIVATE HEALTH INSURANCE

## 2016-08-14 DIAGNOSIS — R310 Gross hematuria: Secondary | ICD-10-CM

## 2016-08-14 DIAGNOSIS — E291 Testicular hypofunction: Secondary | ICD-10-CM

## 2016-08-14 MED ORDER — TESTOSTERONE CYPIONATE 200 MG/ML IM SOLN
200.0000 mg | Freq: Once | INTRAMUSCULAR | Status: AC
  Administered 2016-08-14: 200 mg via INTRAMUSCULAR

## 2016-08-14 NOTE — Addendum Note (Signed)
Addended by: Dellis AnesYANCEY, Bird Swetz on: 08/14/2016 04:22 PM   Modules accepted: Orders

## 2016-08-14 NOTE — Progress Notes (Signed)
Testosterone IM Injection  Due to Hypogonadism patient is present today for a Testosterone Injection.  Medication: Testosterone Cypionate Dose: 1mL Location: right upper outer buttocks Lot: [id number] Exp:12/2017  Patient tolerated well, no complications were noted  Preformed by: Rupert Stackshelsea Delicia Berens, LPN   Follow up: u/a today was obtained for post ER visit f/u

## 2016-08-15 LAB — URINALYSIS, COMPLETE
Bilirubin, UA: NEGATIVE
GLUCOSE, UA: NEGATIVE
KETONES UA: NEGATIVE
Leukocytes, UA: NEGATIVE
NITRITE UA: NEGATIVE
Protein, UA: NEGATIVE
RBC UA: NEGATIVE
Specific Gravity, UA: 1.02 (ref 1.005–1.030)
UUROB: 0.2 mg/dL (ref 0.2–1.0)
pH, UA: 6 (ref 5.0–7.5)

## 2016-08-15 LAB — MICROSCOPIC EXAMINATION
Bacteria, UA: NONE SEEN
RBC MICROSCOPIC, UA: NONE SEEN /HPF (ref 0–?)

## 2016-08-26 ENCOUNTER — Telehealth: Payer: Self-pay | Admitting: Urology

## 2016-08-26 NOTE — Telephone Encounter (Signed)
Pt called and said he needs his medicine filled before his appt on Friday.

## 2016-08-26 NOTE — Telephone Encounter (Signed)
Medication refilled

## 2016-08-28 ENCOUNTER — Ambulatory Visit (INDEPENDENT_AMBULATORY_CARE_PROVIDER_SITE_OTHER): Payer: PRIVATE HEALTH INSURANCE | Admitting: Family Medicine

## 2016-08-28 DIAGNOSIS — E291 Testicular hypofunction: Secondary | ICD-10-CM | POA: Diagnosis not present

## 2016-08-28 MED ORDER — TESTOSTERONE CYPIONATE 200 MG/ML IM SOLN
200.0000 mg | Freq: Once | INTRAMUSCULAR | Status: AC
  Administered 2016-08-28: 200 mg via INTRAMUSCULAR

## 2016-08-28 NOTE — Progress Notes (Signed)
Testosterone IM Injection  Due to Hypogonadism patient is present today for a Testosterone Injection.  Medication: Testosterone Cypionate Dose: 1ML Location: left upper outer buttocks Lot: [id number] Exp:01/2018  Patient tolerated well, no complications were noted  Preformed by: Teressa Lowerarrie Kennette Cuthrell, CMA  Follow up: 2 weeks

## 2016-08-29 ENCOUNTER — Ambulatory Visit: Payer: PRIVATE HEALTH INSURANCE

## 2016-09-10 ENCOUNTER — Ambulatory Visit (INDEPENDENT_AMBULATORY_CARE_PROVIDER_SITE_OTHER): Payer: PRIVATE HEALTH INSURANCE

## 2016-09-10 DIAGNOSIS — E291 Testicular hypofunction: Secondary | ICD-10-CM

## 2016-09-10 MED ORDER — TESTOSTERONE CYPIONATE 200 MG/ML IM SOLN
200.0000 mg | Freq: Once | INTRAMUSCULAR | Status: AC
  Administered 2016-09-10: 200 mg via INTRAMUSCULAR

## 2016-09-10 NOTE — Progress Notes (Signed)
Testosterone IM Injection  Due to Hypogonadism patient is present today for a Testosterone Injection.  Medication: Testosterone Cypionate Dose: 1mL Location: right upper outer buttocks Lot: [id number] Exp:01/2018  Patient tolerated well, no complications were noted  Preformed by: Rupert Stackshelsea Watkins, LPN   Follow up: 2 weeks

## 2016-09-24 ENCOUNTER — Ambulatory Visit (INDEPENDENT_AMBULATORY_CARE_PROVIDER_SITE_OTHER): Payer: PRIVATE HEALTH INSURANCE

## 2016-09-24 DIAGNOSIS — E291 Testicular hypofunction: Secondary | ICD-10-CM

## 2016-09-24 MED ORDER — TESTOSTERONE CYPIONATE 200 MG/ML IM SOLN
200.0000 mg | Freq: Once | INTRAMUSCULAR | Status: AC
  Administered 2016-09-24: 200 mg via INTRAMUSCULAR

## 2016-09-24 NOTE — Progress Notes (Signed)
Testosterone IM Injection  Due to Hypogonadism patient is present today for a Testosterone Injection.  Medication: Testosterone Cypionate Dose: 1mL Location: left upper outer buttocks Lot: [id number] Exp:01/2018  Patient tolerated well, no complications were noted  Preformed by: Rupert Stackshelsea Watkins, LPN  Follow up: 2 weeks

## 2016-10-07 ENCOUNTER — Ambulatory Visit (INDEPENDENT_AMBULATORY_CARE_PROVIDER_SITE_OTHER): Payer: PRIVATE HEALTH INSURANCE

## 2016-10-07 DIAGNOSIS — E291 Testicular hypofunction: Secondary | ICD-10-CM

## 2016-10-07 MED ORDER — TESTOSTERONE CYPIONATE 200 MG/ML IM SOLN
200.0000 mg | Freq: Once | INTRAMUSCULAR | Status: AC
  Administered 2016-10-07: 200 mg via INTRAMUSCULAR

## 2016-10-07 NOTE — Progress Notes (Signed)
Testosterone IM Injection  Due to Hypogonadism patient is present today for a Testosterone Injection.  Medication: Testosterone Cypionate Dose: 1mL Location: left upper outer buttocks Lot: [id number] Exp:01/2018  Patient tolerated well, no complications were noted  Preformed by: Ceriah Kohler, LPN  Follow up: 2 weeks  

## 2016-10-22 ENCOUNTER — Ambulatory Visit (INDEPENDENT_AMBULATORY_CARE_PROVIDER_SITE_OTHER): Payer: PRIVATE HEALTH INSURANCE

## 2016-10-22 DIAGNOSIS — E291 Testicular hypofunction: Secondary | ICD-10-CM

## 2016-10-22 MED ORDER — TESTOSTERONE CYPIONATE 200 MG/ML IM SOLN
200.0000 mg | Freq: Once | INTRAMUSCULAR | Status: AC
  Administered 2016-10-22: 200 mg via INTRAMUSCULAR

## 2016-10-22 NOTE — Progress Notes (Signed)
Testosterone IM Injection  Due to Hypogonadism patient is present today for a Testosterone Injection.  Medication: Testosterone Cypionate Dose: 1mL Location: right upper outer buttocks Lot: [id number] Exp:04/2018  Patient tolerated well, no complications were noted  Preformed by: Rupert Stackshelsea Kiffany Schelling, LPN

## 2016-11-05 ENCOUNTER — Ambulatory Visit (INDEPENDENT_AMBULATORY_CARE_PROVIDER_SITE_OTHER): Payer: PRIVATE HEALTH INSURANCE

## 2016-11-05 DIAGNOSIS — E291 Testicular hypofunction: Secondary | ICD-10-CM | POA: Diagnosis not present

## 2016-11-05 MED ORDER — TESTOSTERONE CYPIONATE 200 MG/ML IM SOLN
200.0000 mg | Freq: Once | INTRAMUSCULAR | Status: AC
  Administered 2016-11-05: 200 mg via INTRAMUSCULAR

## 2016-11-05 NOTE — Progress Notes (Signed)
Testosterone IM Injection  Due to Hypogonadism patient is present today for a Testosterone Injection.  Medication: Testosterone Cypionate Dose: 1mL Location: left upper outer buttocks Lot: [id number] Exp:04/2018  Patient tolerated well, no complications were noted  Preformed by: Braison Snoke, LPN   Follow up: 2 weeks  

## 2016-11-19 ENCOUNTER — Ambulatory Visit (INDEPENDENT_AMBULATORY_CARE_PROVIDER_SITE_OTHER): Payer: BLUE CROSS/BLUE SHIELD

## 2016-11-19 DIAGNOSIS — E291 Testicular hypofunction: Secondary | ICD-10-CM | POA: Diagnosis not present

## 2016-11-19 MED ORDER — TESTOSTERONE CYPIONATE 200 MG/ML IM SOLN
200.0000 mg | Freq: Once | INTRAMUSCULAR | Status: AC
  Administered 2016-11-19: 200 mg via INTRAMUSCULAR

## 2016-11-19 NOTE — Progress Notes (Signed)
Testosterone IM Injection  Due to Hypogonadism patient is present today for a Testosterone Injection.  Medication: Testosterone Cypionate Dose: 1mL Location: right upper outer buttocks Lot: [id number] Exp:05/2018  Patient tolerated well, no complications were noted  Preformed by: Jady Braggs, LPN   Follow up: 2 weeks  

## 2016-12-03 ENCOUNTER — Ambulatory Visit: Payer: Self-pay

## 2016-12-03 ENCOUNTER — Ambulatory Visit: Payer: PRIVATE HEALTH INSURANCE

## 2016-12-05 ENCOUNTER — Ambulatory Visit (INDEPENDENT_AMBULATORY_CARE_PROVIDER_SITE_OTHER): Payer: BLUE CROSS/BLUE SHIELD

## 2016-12-05 DIAGNOSIS — E291 Testicular hypofunction: Secondary | ICD-10-CM

## 2016-12-05 MED ORDER — TESTOSTERONE CYPIONATE 200 MG/ML IM SOLN
200.0000 mg | Freq: Once | INTRAMUSCULAR | Status: AC
  Administered 2016-12-05: 200 mg via INTRAMUSCULAR

## 2016-12-05 NOTE — Progress Notes (Signed)
Testosterone IM Injection  Due to Hypogonadism patient is present today for a Testosterone Injection.  Medication: Testosterone Cypionate Dose: 1mL Location: left upper outer buttocks Lot: [id number] Exp:04/2018  Patient tolerated well, no complications were noted  Preformed by: Rupert Stackshelsea Watkins, LPN   Follow up: Pt made OV with Shannon at check out.

## 2016-12-08 DIAGNOSIS — G4733 Obstructive sleep apnea (adult) (pediatric): Secondary | ICD-10-CM | POA: Diagnosis not present

## 2016-12-16 ENCOUNTER — Encounter: Payer: Self-pay | Admitting: Urology

## 2016-12-16 ENCOUNTER — Ambulatory Visit: Payer: BLUE CROSS/BLUE SHIELD | Admitting: Urology

## 2016-12-16 VITALS — BP 126/80 | HR 76 | Ht 76.0 in | Wt 278.9 lb

## 2016-12-16 DIAGNOSIS — N3001 Acute cystitis with hematuria: Secondary | ICD-10-CM

## 2016-12-16 DIAGNOSIS — E291 Testicular hypofunction: Secondary | ICD-10-CM | POA: Diagnosis not present

## 2016-12-16 DIAGNOSIS — N529 Male erectile dysfunction, unspecified: Secondary | ICD-10-CM | POA: Diagnosis not present

## 2016-12-16 DIAGNOSIS — N401 Enlarged prostate with lower urinary tract symptoms: Secondary | ICD-10-CM | POA: Diagnosis not present

## 2016-12-16 DIAGNOSIS — N138 Other obstructive and reflux uropathy: Secondary | ICD-10-CM

## 2016-12-16 MED ORDER — TESTOSTERONE CYPIONATE 200 MG/ML IM SOLN
200.0000 mg | Freq: Once | INTRAMUSCULAR | Status: AC
Start: 2016-12-16 — End: 2016-12-16
  Administered 2016-12-16: 200 mg via INTRAMUSCULAR

## 2016-12-16 NOTE — Progress Notes (Signed)
Testosterone IM Injection  Due to Hypogonadism patient is present today for a Testosterone Injection.  Medication: Testosterone Cypionate Dose: 1ml Location: right upper outer buttocks Lot: [id number] Exp:05/2018  Patient tolerated well, no complications were noted  Preformed by: Dallas Schimkeamona Williams CMA  Follow up: 2 weeks

## 2016-12-16 NOTE — Progress Notes (Signed)
12/16/2016 3:58 PM   Robert ReeBruce B Trevino 06/28/1971 [id number]  Referring provider: Geoffery LyonsEric M Turner, PA [address] [place], [place] [id number]  Chief Complaint  Patient presents with  . Hypogonadism    last seen 04/2016    HPI: Patient is a 46 year old Caucasian male with hypogonadism, erectile dysfunction and BPH with LU TS who presents today for a 6 months follow up.   Hypogonadism Patient is not experiencing a decrease in libido, a lack of energy, a decrease in strength, a loss in height, a decreased enjoyment in life, sadness and/or grumpiness, erections being less strong, a recent deterioration in an ability to play sports, falling asleep after dinner and a recent deterioration in their work performance.  This is indicated by his responses to the ADAM questionnaire.  He is still having spontaneous erections at night.  He has sleep apnea and is sleeping with a CPAP machine.  His last testosterone level was 800 ng/dL on 19/44/782909/15/2017.  He is currently managing his hypogonadism with testosterone cypionate 200 mg/mL, 1 cc every two weeks.       Androgen Deficiency in the Aging Male    Row Name 12/16/16 1500         Androgen Deficiency in the Aging Male   Do you have a decrease in libido (sex drive) No     Do you have lack of energy No     Do you have a decrease in strength and/or endurance No     Have you lost height No     Have you noticed a decreased "enjoyment of life" No     Are you sad and/or grumpy No     Are your erections less strong No     Have you noticed a recent deterioration in your ability to play sports No     Are you falling asleep after dinner No     Has there been a recent deterioration in your work performance No        Erectile dysfunction His SHIM score is 23, which is no ED.   His previous SHIM score was 22.  He has been having difficulty with erections for last few years.  His major complaint is getting.  His libido is preserved.   His risk factors for ED are  age, BPH, hypogonadism and sleep apnea (he sleeps with a CPAP machine).    He denies any painful erections or curvatures with his erections.        SHIM    Row Name 12/16/16 1516         SHIM: Over the last 6 months:   How do you rate your confidence that you could get and keep an erection? High     When you had erections with sexual stimulation, how often were your erections hard enough for penetration (entering your partner)? Almost Always or Always     During sexual intercourse, how often were you able to maintain your erection after you had penetrated (entered) your partner? Not Difficult     During sexual intercourse, how difficult was it to maintain your erection to completion of intercourse? Not Difficult     When you attempted sexual intercourse, how often was it satisfactory for you? Slightly Difficult       SHIM Total Score   SHIM 23        Score: 1-7 Severe ED 8-11 Moderate ED 12-16 Mild-Moderate ED 17-21 Mild ED 22-25 No ED  BPH WITH  LUTS His IPSS score today is 2, which is mild lower urinary tract symptomatology. He is mostly satisfied with his quality life due to his urinary symptoms.    His has no major complaints today.  He denies any dysuria, hematuria or suprapubic pain.      He also denies any recent fevers, chills, nausea or vomiting.  He does not have a family history of PCa      IPSS    Row Name 12/16/16 1500         International Prostate Symptom Score   How often have you had the sensation of not emptying your bladder? Not at All     How often have you had to urinate less than every two hours? Not at All     How often have you found you stopped and started again several times when you urinated? Less than 1 in 5 times     How often have you found it difficult to postpone urination? Not at All     How often have you had a weak urinary stream? Not at All     How often have you had to strain to start urination? Not at All     How many times did  you typically get up at night to urinate? 1 Time     Total IPSS Score 2       Quality of Life due to urinary symptoms   If you were to spend the rest of your life with your urinary condition just the way it is now how would you feel about that? Mostly Satisfied        Score:  1-7 Mild 8-19 Moderate 20-35 Severe      PMH: Past Medical History:  Diagnosis Date  . Low testosterone   . Sleep apnea     Surgical History: Past Surgical History:  Procedure Laterality Date  . none      Home Medications:  Allergies as of 12/16/2016   No Known Allergies     Medication List       Accurate as of 12/16/16  3:58 PM. Always use your most recent med list.          cephALEXin 500 MG capsule Commonly known as:  KEFLEX Take 1 capsule (500 mg total) by mouth 3 (three) times daily.   fluticasone 50 MCG/ACT nasal spray Commonly known as:  FLONASE   phenazopyridine 200 MG tablet Commonly known as:  PYRIDIUM Take 1 tablet (200 mg total) by mouth 3 (three) times daily as needed for pain.   testosterone cypionate 200 MG/ML injection Commonly known as:  DEPOTESTOSTERONE CYPIONATE INJECT 1 MILLILITER INTO THE MUSCLE EVERY 14 DAYS       Allergies: No Known Allergies  Family History: Family History  Problem Relation Age of Onset  . Kidney disease Neg Hx   . Prostate cancer Neg Hx   . Kidney cancer Neg Hx     Social History:  reports that he has never smoked. He has never used smokeless tobacco. He reports that he drinks alcohol. He reports that he does not use drugs.  ROS: UROLOGY Frequent Urination?: No Hard to postpone urination?: No Burning/pain with urination?: No Get up at night to urinate?: No Leakage of urine?: No Urine stream starts and stops?: No Trouble starting stream?: No Do you have to strain to urinate?: No Blood in urine?: No Urinary tract infection?: No Sexually transmitted disease?: No Injury to kidneys or bladder?: No Painful intercourse?:  No Weak stream?: No Erection problems?: No Penile pain?: No  Gastrointestinal Nausea?: No Vomiting?: No Indigestion/heartburn?: No Diarrhea?: No Constipation?: No  Constitutional Fever: No Night sweats?: No Weight loss?: No Fatigue?: No  Skin Skin rash/lesions?: No Itching?: No  Eyes Blurred vision?: No Double vision?: No  Ears/Nose/Throat Sore throat?: No Sinus problems?: No  Hematologic/Lymphatic Swollen glands?: No Easy bruising?: No  Cardiovascular Leg swelling?: No Chest pain?: No  Respiratory Cough?: No Shortness of breath?: No  Endocrine Excessive thirst?: No  Musculoskeletal Back pain?: No Joint pain?: No  Neurological Headaches?: No Dizziness?: No  Psychologic Depression?: No Anxiety?: No  Physical Exam: BP 126/80   Pulse 76   Ht 6\' 4"  (1.93 m)   Wt 278 lb 14.4 oz (126.5 kg)   BMI 33.95 kg/m   Constitutional: Well nourished. Alert and oriented, No acute distress. HEENT: [place] AT, moist mucus membranes. Trachea midline, no masses. Cardiovascular: No clubbing, cyanosis, or edema. Respiratory: Normal respiratory effort, no increased work of breathing. GI: Abdomen is soft, non tender, non distended, no abdominal masses. Liver and spleen not palpable.  No hernias appreciated.  Stool sample for occult testing is not indicated.   GU: No CVA tenderness.  No bladder fullness or masses.  Patient with circumcised phallus.   Urethral meatus is patent.  No penile discharge. No penile lesions or rashes. Scrotum without lesions, cysts, rashes and/or edema.  Testicles are located scrotally bilaterally. No masses are appreciated in the testicles. Left and right epididymis are normal. Rectal: Patient with  normal sphincter tone. Anus and perineum without scarring or rashes. No rectal masses are appreciated. Prostate is approximately 50 grams, no nodules are appreciated. Seminal vesicles are normal. Skin: No rashes, bruises or suspicious lesions. Lymph: No  cervical or inguinal adenopathy. Neurologic: Grossly intact, no focal deficits, moving all 4 extremities. Psychiatric: Normal mood and affect.  Laboratory Data: Lab Results  Component Value Date   HCT 50.7 08/01/2016   PSA History  0.7 ng/dL on 40/98/1191   Lab Results  Component Value Date   TESTOSTERONE 800 08/01/2016    Urinalysis Unremarkable.  See EPIC.     Assessment & Plan:    1. Hypogonadism:     -most recent testosterone level is 800 ng/dL on 47/82/9562  -continue testosterone cypionate 200 mg/mL , 1 cc IM every two weeks  -RTC in 3 months for HCT and testosterone  -RTC in 6 months for HCT, testosterone, PSA, LFT's, ADAM and exam  - Testosterone  - Hematocrit  2. BPH with LUTS  - IPSS score is 2/2  - Continue conservative management, avoiding bladder irritants and timed voiding's  - RTC in 6 months for IPSS, PSA and exam, as testosterone therapy can cause prostate enlargement and worsen LUTS  - PSA  3. Erectile dysfunction:     -SHIM score is 23  -RTC in 6 months for SHIM score and exam, as testosterone therapy can affect erections  4. UTI with hematuria  - resolved  - UA unremarkable today  - Urinalysis, Complete   Return in about 3 months (around 03/16/2017) for HCT and testosterone level one week after an injection.  These notes generated with voice recognition software. I apologize for typographical errors.  Michiel Cowboy, PA-C  [facility] [address], [address] [place], [place] [address] [phone]

## 2016-12-17 LAB — URINALYSIS, COMPLETE
BILIRUBIN UA: NEGATIVE
GLUCOSE, UA: NEGATIVE
KETONES UA: NEGATIVE
LEUKOCYTES UA: NEGATIVE
Nitrite, UA: NEGATIVE
PROTEIN UA: NEGATIVE
RBC UA: NEGATIVE
SPEC GRAV UA: 1.01 (ref 1.005–1.030)
Urobilinogen, Ur: 0.2 mg/dL (ref 0.2–1.0)
pH, UA: 6.5 (ref 5.0–7.5)

## 2016-12-17 LAB — MICROSCOPIC EXAMINATION
Bacteria, UA: NONE SEEN
RBC MICROSCOPIC, UA: NONE SEEN /HPF (ref 0–?)

## 2016-12-17 LAB — BUN+CREAT
BUN/Creatinine Ratio: 15 (ref 9–20)
BUN: 14 mg/dL (ref 6–24)
Creatinine, Ser: 0.94 mg/dL (ref 0.76–1.27)
GFR calc non Af Amer: 98 mL/min/{1.73_m2} (ref >59)
GFR, EST AFRICAN AMERICAN: 114 mL/min/{1.73_m2} (ref >59)

## 2016-12-17 LAB — PSA: Prostate Specific Ag, Serum: 0.6 ng/mL (ref 0.0–4.0)

## 2016-12-17 LAB — TESTOSTERONE: Testosterone: 440 ng/dL (ref 264–916)

## 2016-12-17 LAB — HEMATOCRIT: Hematocrit: 49.2 % (ref 37.5–51.0)

## 2016-12-31 ENCOUNTER — Ambulatory Visit (INDEPENDENT_AMBULATORY_CARE_PROVIDER_SITE_OTHER): Payer: BLUE CROSS/BLUE SHIELD

## 2016-12-31 DIAGNOSIS — E291 Testicular hypofunction: Secondary | ICD-10-CM | POA: Diagnosis not present

## 2016-12-31 MED ORDER — TESTOSTERONE CYPIONATE 200 MG/ML IM SOLN
200.0000 mg | Freq: Once | INTRAMUSCULAR | Status: AC
  Administered 2016-12-31: 200 mg via INTRAMUSCULAR

## 2016-12-31 NOTE — Progress Notes (Signed)
Testosterone IM Injection  Due to Hypogonadism patient is present today for a Testosterone Injection.  Medication: Testosterone Cypionate Dose: 1mL Location: left upper outer buttocks Lot: [id number] Exp:05/2018  Patient tolerated well, no complications were noted  Preformed by: Rupert Stackshelsea Lakethia Coppess, LPN   Follow up: 2 weeks

## 2017-01-06 DIAGNOSIS — G4733 Obstructive sleep apnea (adult) (pediatric): Secondary | ICD-10-CM | POA: Diagnosis not present

## 2017-01-07 DIAGNOSIS — M5414 Radiculopathy, thoracic region: Secondary | ICD-10-CM | POA: Diagnosis not present

## 2017-01-07 DIAGNOSIS — M9902 Segmental and somatic dysfunction of thoracic region: Secondary | ICD-10-CM | POA: Diagnosis not present

## 2017-01-07 DIAGNOSIS — M5412 Radiculopathy, cervical region: Secondary | ICD-10-CM | POA: Diagnosis not present

## 2017-01-07 DIAGNOSIS — M9901 Segmental and somatic dysfunction of cervical region: Secondary | ICD-10-CM | POA: Diagnosis not present

## 2017-01-14 ENCOUNTER — Ambulatory Visit (INDEPENDENT_AMBULATORY_CARE_PROVIDER_SITE_OTHER): Payer: BLUE CROSS/BLUE SHIELD

## 2017-01-14 DIAGNOSIS — E291 Testicular hypofunction: Secondary | ICD-10-CM

## 2017-01-14 MED ORDER — TESTOSTERONE CYPIONATE 200 MG/ML IM SOLN
200.0000 mg | Freq: Once | INTRAMUSCULAR | Status: AC
  Administered 2017-01-14: 200 mg via INTRAMUSCULAR

## 2017-01-14 NOTE — Progress Notes (Signed)
Testosterone IM Injection  Due to Hypogonadism patient is present today for a Testosterone Injection.  Medication: Testosterone Cypionate Dose: 1mL Location: right upper outer buttocks Lot: [id number] Exp:09/2018  Patient tolerated well, no complications were noted  Preformed by: Julious Langlois, LPN   Follow up: 2 weeks  

## 2017-01-23 DIAGNOSIS — M5412 Radiculopathy, cervical region: Secondary | ICD-10-CM | POA: Diagnosis not present

## 2017-01-23 DIAGNOSIS — M9901 Segmental and somatic dysfunction of cervical region: Secondary | ICD-10-CM | POA: Diagnosis not present

## 2017-01-23 DIAGNOSIS — M5414 Radiculopathy, thoracic region: Secondary | ICD-10-CM | POA: Diagnosis not present

## 2017-01-23 DIAGNOSIS — M9902 Segmental and somatic dysfunction of thoracic region: Secondary | ICD-10-CM | POA: Diagnosis not present

## 2017-01-28 ENCOUNTER — Ambulatory Visit (INDEPENDENT_AMBULATORY_CARE_PROVIDER_SITE_OTHER): Payer: BLUE CROSS/BLUE SHIELD

## 2017-01-28 DIAGNOSIS — E291 Testicular hypofunction: Secondary | ICD-10-CM

## 2017-01-28 MED ORDER — TESTOSTERONE CYPIONATE 200 MG/ML IM SOLN
200.0000 mg | Freq: Once | INTRAMUSCULAR | Status: AC
  Administered 2017-01-28: 200 mg via INTRAMUSCULAR

## 2017-01-28 NOTE — Progress Notes (Signed)
Testosterone IM Injection  Due to Hypogonadism patient is present today for a Testosterone Injection.  Medication: Testosterone Cypionate Dose: 1mL Location: right upper outer buttocks Lot: [id number] Exp:09/2018  Patient tolerated well, no complications were noted  Preformed by: Aniyha Tate, LPN   Follow up: 2 weeks  

## 2017-02-03 DIAGNOSIS — M9902 Segmental and somatic dysfunction of thoracic region: Secondary | ICD-10-CM | POA: Diagnosis not present

## 2017-02-03 DIAGNOSIS — M9901 Segmental and somatic dysfunction of cervical region: Secondary | ICD-10-CM | POA: Diagnosis not present

## 2017-02-03 DIAGNOSIS — M5414 Radiculopathy, thoracic region: Secondary | ICD-10-CM | POA: Diagnosis not present

## 2017-02-03 DIAGNOSIS — M5412 Radiculopathy, cervical region: Secondary | ICD-10-CM | POA: Diagnosis not present

## 2017-02-05 DIAGNOSIS — G4733 Obstructive sleep apnea (adult) (pediatric): Secondary | ICD-10-CM | POA: Diagnosis not present

## 2017-02-11 ENCOUNTER — Ambulatory Visit (INDEPENDENT_AMBULATORY_CARE_PROVIDER_SITE_OTHER): Payer: BLUE CROSS/BLUE SHIELD

## 2017-02-11 DIAGNOSIS — E291 Testicular hypofunction: Secondary | ICD-10-CM | POA: Diagnosis not present

## 2017-02-11 MED ORDER — TESTOSTERONE CYPIONATE 200 MG/ML IM SOLN
200.0000 mg | Freq: Once | INTRAMUSCULAR | Status: AC
  Administered 2017-02-11: 200 mg via INTRAMUSCULAR

## 2017-02-11 NOTE — Progress Notes (Signed)
Testosterone IM Injection  Due to Hypogonadism patient is present today for a Testosterone Injection.  Medication: Testosterone Cypionate Dose: 1mL Location: right upper outer buttocks Lot: [id number] Exp:09/2018  Patient tolerated well, no complications were noted  Preformed by: Chelsea Watkins, LPN   Follow up: 2 weeks  

## 2017-02-18 DIAGNOSIS — M9901 Segmental and somatic dysfunction of cervical region: Secondary | ICD-10-CM | POA: Diagnosis not present

## 2017-02-18 DIAGNOSIS — M5412 Radiculopathy, cervical region: Secondary | ICD-10-CM | POA: Diagnosis not present

## 2017-02-18 DIAGNOSIS — M5414 Radiculopathy, thoracic region: Secondary | ICD-10-CM | POA: Diagnosis not present

## 2017-02-18 DIAGNOSIS — M9902 Segmental and somatic dysfunction of thoracic region: Secondary | ICD-10-CM | POA: Diagnosis not present

## 2017-02-25 ENCOUNTER — Ambulatory Visit (INDEPENDENT_AMBULATORY_CARE_PROVIDER_SITE_OTHER): Payer: BLUE CROSS/BLUE SHIELD

## 2017-02-25 DIAGNOSIS — E291 Testicular hypofunction: Secondary | ICD-10-CM | POA: Diagnosis not present

## 2017-02-25 MED ORDER — TESTOSTERONE CYPIONATE 200 MG/ML IM SOLN
200.0000 mg | Freq: Once | INTRAMUSCULAR | Status: AC
  Administered 2017-02-25: 200 mg via INTRAMUSCULAR

## 2017-02-25 NOTE — Progress Notes (Signed)
Testosterone IM Injection  Due to Hypogonadism patient is present today for a Testosterone Injection.  Medication: Testosterone Cypionate Dose: 1mL Location: left upper outer buttocks Lot: [id number] Exp:09/2018  Patient tolerated well, no complications were noted  Preformed by: Chelsea Watkins, LPN   Follow up: 2 weeks  

## 2017-03-09 DIAGNOSIS — G4733 Obstructive sleep apnea (adult) (pediatric): Secondary | ICD-10-CM | POA: Diagnosis not present

## 2017-03-11 ENCOUNTER — Ambulatory Visit (INDEPENDENT_AMBULATORY_CARE_PROVIDER_SITE_OTHER): Payer: BLUE CROSS/BLUE SHIELD

## 2017-03-11 DIAGNOSIS — E291 Testicular hypofunction: Secondary | ICD-10-CM | POA: Diagnosis not present

## 2017-03-11 MED ORDER — TESTOSTERONE CYPIONATE 200 MG/ML IM SOLN
200.0000 mg | Freq: Once | INTRAMUSCULAR | Status: AC
  Administered 2017-03-11: 200 mg via INTRAMUSCULAR

## 2017-03-11 NOTE — Progress Notes (Signed)
Testosterone IM Injection  Due to Hypogonadism patient is present today for a Testosterone Injection.  Medication: Testosterone Cypionate Dose: 1mL Location: right upper outer buttocks Lot: [id number] Exp:09/2018  Patient tolerated well, no complications were noted  Preformed by: Rupert Stacks, LPN   Follow up: pt had labs drawn today.

## 2017-03-12 ENCOUNTER — Telehealth: Payer: Self-pay

## 2017-03-12 LAB — HEMATOCRIT: Hematocrit: 49.1 % (ref 37.5–51.0)

## 2017-03-12 LAB — TESTOSTERONE: Testosterone: 483 ng/dL (ref 264–916)

## 2017-03-12 NOTE — Telephone Encounter (Signed)
-----   Message from Harle Battiest, PA-C sent at 03/12/2017  7:03 AM EDT ----- Please let Mr. Mcluckie know his labs are good and I need to see him in three months.

## 2017-03-12 NOTE — Telephone Encounter (Signed)
Spoke with pt in reference to lab results. Pt voiced understanding.  

## 2017-03-18 DIAGNOSIS — M5412 Radiculopathy, cervical region: Secondary | ICD-10-CM | POA: Diagnosis not present

## 2017-03-18 DIAGNOSIS — M9902 Segmental and somatic dysfunction of thoracic region: Secondary | ICD-10-CM | POA: Diagnosis not present

## 2017-03-18 DIAGNOSIS — M9901 Segmental and somatic dysfunction of cervical region: Secondary | ICD-10-CM | POA: Diagnosis not present

## 2017-03-18 DIAGNOSIS — M5414 Radiculopathy, thoracic region: Secondary | ICD-10-CM | POA: Diagnosis not present

## 2017-03-25 ENCOUNTER — Ambulatory Visit (INDEPENDENT_AMBULATORY_CARE_PROVIDER_SITE_OTHER): Payer: BLUE CROSS/BLUE SHIELD

## 2017-03-25 DIAGNOSIS — E291 Testicular hypofunction: Secondary | ICD-10-CM | POA: Diagnosis not present

## 2017-03-25 MED ORDER — TESTOSTERONE CYPIONATE 200 MG/ML IM SOLN
200.0000 mg | Freq: Once | INTRAMUSCULAR | Status: AC
  Administered 2017-03-25: 200 mg via INTRAMUSCULAR

## 2017-03-25 NOTE — Progress Notes (Signed)
Testosterone IM Injection  Due to Hypogonadism patient is present today for a Testosterone Injection.  Medication: Testosterone Cypionate Dose: 1mL Location: left upper outer buttocks Lot: [id number] Exp:09/2018  Patient tolerated well, no complications were noted  Preformed by: Lucina Betty, LPN   Follow up: 2 weeks  

## 2017-04-06 DIAGNOSIS — G4733 Obstructive sleep apnea (adult) (pediatric): Secondary | ICD-10-CM | POA: Diagnosis not present

## 2017-04-08 ENCOUNTER — Ambulatory Visit (INDEPENDENT_AMBULATORY_CARE_PROVIDER_SITE_OTHER): Payer: BLUE CROSS/BLUE SHIELD

## 2017-04-08 DIAGNOSIS — E291 Testicular hypofunction: Secondary | ICD-10-CM | POA: Diagnosis not present

## 2017-04-08 MED ORDER — TESTOSTERONE CYPIONATE 200 MG/ML IM SOLN
200.0000 mg | Freq: Once | INTRAMUSCULAR | Status: AC
  Administered 2017-04-08: 200 mg via INTRAMUSCULAR

## 2017-04-08 NOTE — Progress Notes (Signed)
Testosterone IM Injection  Due to Hypogonadism patient is present today for a Testosterone Injection.  Medication: Testosterone Cypionate Dose: 1ml Location: right upper outer buttocks Lot: [id number] Exp:09/2018  Patient tolerated well, no complications were noted  Preformed by: C. Rana SnareLowe, CMA  Follow up: 2 weeks

## 2017-04-20 NOTE — Progress Notes (Signed)
04/22/2017 1:56 PM   Robert Trevino 01/10/1971 [id number]  Referring provider: Geoffery Lyonsurner, Eric M, PA [address] [place], [place] [id number]  Chief Complaint  Patient presents with  . Hypogonadism    6 month follow up    HPI: Patient is a 46 year old Caucasian male with testosterone deficiency, erectile dysfunction and BPH with LU TS who presents today for a 6 months follow up.   Testosterone deficiency Patient is not experiencing a lack of energy, a decrease in strength, sadness and/or grumpiness and a recent deterioration in an ability to play sports.  This is indicated by his responses to the ADAM questionnaire.  He is still having spontaneous erections at night.  He has sleep apnea and is sleeping with a CPAP machine.  His last testosterone level was 483 ng/dL on 19/14/782904/25/2018.  He is currently managing his hypogonadism with testosterone cypionate 200 mg/mL, 1 cc every two weeks.       Androgen Deficiency in the Aging Male    Row Name 04/22/17 1300         Androgen Deficiency in the Aging Male   Do you have a decrease in libido (sex drive) No     Do you have lack of energy Yes     Do you have a decrease in strength and/or endurance Yes     Have you lost height No     Have you noticed a decreased "enjoyment of life" No     Are you sad and/or grumpy Yes     Are your erections less strong No     Have you noticed a recent deterioration in your ability to play sports Yes     Are you falling asleep after dinner No     Has there been a recent deterioration in your work performance No        Erectile dysfunction His SHIM score is 22, which is no ED.   His previous SHIM score was 23.  He has been having difficulty with erections for last few years.  His major complaint is getting.  His libido is preserved.   His risk factors for ED are age, BPH, testosterone deficiency and sleep apnea (he sleeps with a CPAP machine).    He denies any painful erections or curvatures with his erections.          SHIM    Row Name 04/22/17 1345         SHIM: Over the last 6 months:   How do you rate your confidence that you could get and keep an erection? Moderate     When you had erections with sexual stimulation, how often were your erections hard enough for penetration (entering your partner)? Almost Always or Always     During sexual intercourse, how often were you able to maintain your erection after you had penetrated (entered) your partner? Most Times (much more than half the time)     During sexual intercourse, how difficult was it to maintain your erection to completion of intercourse? Not Difficult     When you attempted sexual intercourse, how often was it satisfactory for you? Almost Always or Always       SHIM Total Score   SHIM 22        Score: 1-7 Severe ED 8-11 Moderate ED 12-16 Mild-Moderate ED 17-21 Mild ED 22-25 No ED  BPH WITH LUTS His IPSS score today is 2, which is mild lower urinary tract symptomatology. He  is mostly satisfied with his quality life due to his urinary symptoms.  His previous I PSS score was 2/2.    His has no major complaints today.  He denies any dysuria, hematuria or suprapubic pain.      He also denies any recent fevers, chills, nausea or vomiting.  He does not have a family history of PCa      IPSS    Row Name 04/22/17 1300         International Prostate Symptom Score   How often have you had the sensation of not emptying your bladder? Not at All     How often have you had to urinate less than every two hours? Less than 1 in 5 times     How often have you found you stopped and started again several times when you urinated? Not at All     How often have you found it difficult to postpone urination? Not at All     How often have you had a weak urinary stream? Not at All     How often have you had to strain to start urination? Not at All     How many times did you typically get up at night to urinate? 1 Time     Total IPSS Score 2        Quality of Life due to urinary symptoms   If you were to spend the rest of your life with your urinary condition just the way it is now how would you feel about that? Mostly Satisfied        Score:  1-7 Mild 8-19 Moderate 20-35 Severe  PMH: Past Medical History:  Diagnosis Date  . Low testosterone   . Sleep apnea     Surgical History: Past Surgical History:  Procedure Laterality Date  . none      Home Medications:  Allergies as of 04/22/2017   No Known Allergies     Medication List       Accurate as of 04/22/17  1:56 PM. Always use your most recent med list.          cephALEXin 500 MG capsule Commonly known as:  KEFLEX Take 1 capsule (500 mg total) by mouth 3 (three) times daily.   fluticasone 50 MCG/ACT nasal spray Commonly known as:  FLONASE   phenazopyridine 200 MG tablet Commonly known as:  PYRIDIUM Take 1 tablet (200 mg total) by mouth 3 (three) times daily as needed for pain.   testosterone cypionate 200 MG/ML injection Commonly known as:  DEPOTESTOSTERONE CYPIONATE INJECT 1 MILLILITER INTO THE MUSCLE EVERY 14 DAYS       Allergies: No Known Allergies  Family History: Family History  Problem Relation Age of Onset  . Kidney disease Neg Hx   . Prostate cancer Neg Hx   . Kidney cancer Neg Hx   . Bladder Cancer Neg Hx     Social History:  reports that he has never smoked. He has never used smokeless tobacco. He reports that he drinks alcohol. He reports that he does not use drugs.  ROS: UROLOGY Frequent Urination?: No Hard to postpone urination?: No Burning/pain with urination?: No Get up at night to urinate?: No Leakage of urine?: No Urine stream starts and stops?: No Trouble starting stream?: No Do you have to strain to urinate?: No Blood in urine?: No Urinary tract infection?: No Sexually transmitted disease?: No Injury to kidneys or bladder?: No Painful intercourse?: No Weak stream?: No  Erection problems?: No Penile pain?:  No  Gastrointestinal Nausea?: No Vomiting?: No Indigestion/heartburn?: No Diarrhea?: No Constipation?: No  Constitutional Fever: No Night sweats?: No Weight loss?: No Fatigue?: Yes  Skin Skin rash/lesions?: No Itching?: No  Eyes Blurred vision?: No Double vision?: No  Ears/Nose/Throat Sore throat?: No Sinus problems?: No  Hematologic/Lymphatic Swollen glands?: No Easy bruising?: No  Cardiovascular Leg swelling?: No Chest pain?: No  Respiratory Cough?: No Shortness of breath?: No  Endocrine Excessive thirst?: No  Musculoskeletal Back pain?: No Joint pain?: Yes  Neurological Headaches?: No Dizziness?: No  Psychologic Depression?: No Anxiety?: No  Physical Exam: BP 128/86   Pulse 61   Ht 6\' 4"  (1.93 m)   Wt 284 lb 6.4 oz (129 kg)   BMI 34.62 kg/m   Constitutional: Well nourished. Alert and oriented, No acute distress. HEENT: [place] AT, moist mucus membranes. Trachea midline, no masses. Cardiovascular: No clubbing, cyanosis, or edema. Respiratory: Normal respiratory effort, no increased work of breathing. GI: Abdomen is soft, non tender, non distended, no abdominal masses. Liver and spleen not palpable.  No hernias appreciated.  Stool sample for occult testing is not indicated.   GU: No CVA tenderness.  No bladder fullness or masses.  Patient with circumcised phallus.   Urethral meatus is patent.  No penile discharge. No penile lesions or rashes. Scrotum without lesions, cysts, rashes and/or edema.  Testicles are located scrotally bilaterally. No masses are appreciated in the testicles. Left and right epididymis are normal. Rectal: Patient with  normal sphincter tone. Anus and perineum without scarring or rashes. No rectal masses are appreciated. Prostate is approximately 50 grams, no nodules are appreciated. Seminal vesicles are normal. Skin: No rashes, bruises or suspicious lesions. Lymph: No cervical or inguinal adenopathy. Neurologic: Grossly  intact, no focal deficits, moving all 4 extremities. Psychiatric: Normal mood and affect.  Laboratory Data: Lab Results  Component Value Date   HCT 49.1 03/11/2017   PSA History  0.7 ng/dL on 16/08/9603  0.6 ng/dl on 54/07/8118   Lab Results  Component Value Date   TESTOSTERONE 483 03/11/2017    Assessment & Plan:    1. Testosterone deficiency   -most recent testosterone level is 483 ng/dL on 14/78/2956  -continue testosterone cypionate 200 mg/mL , 1 cc IM every two weeks  -RTC in 6 months for HCT/HBG, testosterone, ADAM and exam  - Testosterone  - Hematocrit/hemoglobin  2. BPH with LUTS  - IPSS score is 2/2  - Continue conservative management, avoiding bladder irritants and timed voiding's  - RTC in 6 months for IPSS, PSA and exam, as testosterone therapy can cause prostate enlargement and worsen LUTS  - PSA  3. Erectile dysfunction:     -SHIM score is 23  -RTC in 6 months for SHIM score and exam, as testosterone therapy can affect erections    Return in about 6 months (around 10/22/2017) for ADAM, IPSS, SHIM and exam, PSA,Testoterone and  HCT/HBG.  These notes generated with voice recognition software. I apologize for typographical errors.  Michiel Cowboy, PA-C  [facility] [address] [place], [place] [address] [phone]

## 2017-04-22 ENCOUNTER — Ambulatory Visit (INDEPENDENT_AMBULATORY_CARE_PROVIDER_SITE_OTHER): Payer: BLUE CROSS/BLUE SHIELD | Admitting: Urology

## 2017-04-22 ENCOUNTER — Encounter: Payer: Self-pay | Admitting: Urology

## 2017-04-22 VITALS — BP 128/86 | HR 61 | Ht 76.0 in | Wt 284.4 lb

## 2017-04-22 DIAGNOSIS — N138 Other obstructive and reflux uropathy: Secondary | ICD-10-CM | POA: Diagnosis not present

## 2017-04-22 DIAGNOSIS — N401 Enlarged prostate with lower urinary tract symptoms: Secondary | ICD-10-CM | POA: Diagnosis not present

## 2017-04-22 DIAGNOSIS — N529 Male erectile dysfunction, unspecified: Secondary | ICD-10-CM

## 2017-04-22 DIAGNOSIS — E349 Endocrine disorder, unspecified: Secondary | ICD-10-CM | POA: Diagnosis not present

## 2017-04-22 MED ORDER — TESTOSTERONE CYPIONATE 200 MG/ML IM SOLN
200.0000 mg | Freq: Once | INTRAMUSCULAR | Status: AC
  Administered 2017-04-22: 200 mg via INTRAMUSCULAR

## 2017-04-22 NOTE — Progress Notes (Signed)
Testosterone IM Injection  Due to Hypogonadism patient is present today for a Testosterone Injection.  Medication: Testosterone Cypionate Dose: 1ml Location: left upper outer buttocks Lot: [id number] Exp:09/2018  Patient tolerated well, no complications were noted.  Preformed by: Dallas Schimkeamona Williams CMA  Follow up: Two weeks

## 2017-04-23 ENCOUNTER — Telehealth: Payer: Self-pay

## 2017-04-23 LAB — HEMATOCRIT: Hematocrit: 47.5 % (ref 37.5–51.0)

## 2017-04-23 LAB — PSA: Prostate Specific Ag, Serum: 0.7 ng/mL (ref 0.0–4.0)

## 2017-04-23 LAB — TESTOSTERONE: TESTOSTERONE: 468 ng/dL (ref 264–916)

## 2017-04-23 LAB — HEMOGLOBIN: Hemoglobin: 16 g/dL (ref 13.0–17.7)

## 2017-04-23 NOTE — Telephone Encounter (Signed)
-----   Message from Harle BattiestShannon A McGowan, PA-C sent at 04/23/2017  7:46 AM EDT ----- Please tell Mr. Robert Trevino that his labs are in the normal range.  His testosterone is still ~ 400, so we will continue to keep an eye on it.  Hopefully, things will improve once he has settled into his new house and established a routine.

## 2017-04-23 NOTE — Telephone Encounter (Signed)
Patient notified of results.

## 2017-05-06 ENCOUNTER — Ambulatory Visit (INDEPENDENT_AMBULATORY_CARE_PROVIDER_SITE_OTHER): Payer: BLUE CROSS/BLUE SHIELD

## 2017-05-06 DIAGNOSIS — G4733 Obstructive sleep apnea (adult) (pediatric): Secondary | ICD-10-CM | POA: Diagnosis not present

## 2017-05-06 DIAGNOSIS — E349 Endocrine disorder, unspecified: Secondary | ICD-10-CM | POA: Diagnosis not present

## 2017-05-06 MED ORDER — TESTOSTERONE CYPIONATE 200 MG/ML IM SOLN
200.0000 mg | Freq: Once | INTRAMUSCULAR | Status: AC
  Administered 2017-05-06: 200 mg via INTRAMUSCULAR

## 2017-05-06 NOTE — Progress Notes (Signed)
Testosterone IM Injection  Due to Hypogonadism patient is present today for a Testosterone Injection.  Medication: Testosterone Cypionate Dose: 1ml Location: right upper outer buttocks Lot: [id number] Exp:09/2018  Patient tolerated well, no complications were noted  Performed by: C. Rana SnareLowe, CMA  Follow up: 2 weeks

## 2017-05-19 ENCOUNTER — Telehealth: Payer: Self-pay

## 2017-05-19 ENCOUNTER — Ambulatory Visit (INDEPENDENT_AMBULATORY_CARE_PROVIDER_SITE_OTHER): Payer: BLUE CROSS/BLUE SHIELD

## 2017-05-19 DIAGNOSIS — E349 Endocrine disorder, unspecified: Secondary | ICD-10-CM

## 2017-05-19 MED ORDER — TESTOSTERONE CYPIONATE 200 MG/ML IM SOLN
200.0000 mg | Freq: Once | INTRAMUSCULAR | Status: AC
  Administered 2017-05-19: 200 mg via INTRAMUSCULAR

## 2017-05-19 NOTE — Telephone Encounter (Signed)
Called pt to make sure he p/u refill for testosterone injection this afternoon. No answer. Left vmail per DPR.

## 2017-05-19 NOTE — Progress Notes (Signed)
Testosterone IM Injection  Due to Hypogonadism patient is present today for a Testosterone Injection.  Medication: Testosterone Cypionate Dose: 1 ml Location: left upper outer buttocks Lot: [id number] Exp:09/2018  Patient tolerated well, no complications were noted  Performed by: C. Aleyda Gindlesperger, CMA  Follow up: 2 weeks  

## 2017-06-03 ENCOUNTER — Ambulatory Visit (INDEPENDENT_AMBULATORY_CARE_PROVIDER_SITE_OTHER): Payer: BLUE CROSS/BLUE SHIELD

## 2017-06-03 DIAGNOSIS — E349 Endocrine disorder, unspecified: Secondary | ICD-10-CM | POA: Diagnosis not present

## 2017-06-03 MED ORDER — TESTOSTERONE CYPIONATE 200 MG/ML IM SOLN
200.0000 mg | Freq: Once | INTRAMUSCULAR | Status: AC
Start: 2017-06-03 — End: 2017-06-03
  Administered 2017-06-03: 200 mg via INTRAMUSCULAR

## 2017-06-03 NOTE — Progress Notes (Signed)
Testosterone IM Injection  Due to Hypogonadism patient is present today for a Testosterone Injection.  Medication: Testosterone Cypionate Dose: 1 ml Location: right upper outer buttocks Lot: [id number] Exp:09/2018  Patient tolerated well, no complications were noted  Preformed by: C. Rana SnareLowe, CMA  Follow up: 2 weeks

## 2017-06-10 DIAGNOSIS — G4733 Obstructive sleep apnea (adult) (pediatric): Secondary | ICD-10-CM | POA: Diagnosis not present

## 2017-06-17 ENCOUNTER — Ambulatory Visit (INDEPENDENT_AMBULATORY_CARE_PROVIDER_SITE_OTHER): Payer: BLUE CROSS/BLUE SHIELD

## 2017-06-17 DIAGNOSIS — E291 Testicular hypofunction: Secondary | ICD-10-CM

## 2017-06-17 MED ORDER — TESTOSTERONE CYPIONATE 200 MG/ML IM SOLN
200.0000 mg | Freq: Once | INTRAMUSCULAR | Status: AC
  Administered 2017-06-17: 200 mg via INTRAMUSCULAR

## 2017-06-17 NOTE — Progress Notes (Signed)
Testosterone IM Injection  Due to Hypogonadism patient is present today for a Testosterone Injection.  Medication: Testosterone Cypionate Dose: 1mL Location: left upper outer buttocks Lot: [id number] Exp:09/2018  Patient tolerated well, no complications were noted  Preformed by: Rupert Stackshelsea Mirenda Baltazar, LPN   Follow up: 2 weeks

## 2017-06-25 DIAGNOSIS — E291 Testicular hypofunction: Secondary | ICD-10-CM | POA: Diagnosis not present

## 2017-06-25 DIAGNOSIS — M79673 Pain in unspecified foot: Secondary | ICD-10-CM | POA: Diagnosis not present

## 2017-06-25 DIAGNOSIS — Z0001 Encounter for general adult medical examination with abnormal findings: Secondary | ICD-10-CM | POA: Diagnosis not present

## 2017-06-26 ENCOUNTER — Other Ambulatory Visit: Payer: Self-pay | Admitting: Urology

## 2017-06-26 NOTE — Telephone Encounter (Signed)
Pt called stating that he needs his refill sent to his pharmacy. He is due for another injection next week. Please advise. Thanks.

## 2017-06-29 DIAGNOSIS — E782 Mixed hyperlipidemia: Secondary | ICD-10-CM | POA: Diagnosis not present

## 2017-06-29 DIAGNOSIS — R5383 Other fatigue: Secondary | ICD-10-CM | POA: Diagnosis not present

## 2017-06-29 DIAGNOSIS — E079 Disorder of thyroid, unspecified: Secondary | ICD-10-CM | POA: Diagnosis not present

## 2017-06-29 DIAGNOSIS — Z0001 Encounter for general adult medical examination with abnormal findings: Secondary | ICD-10-CM | POA: Diagnosis not present

## 2017-06-29 DIAGNOSIS — E0781 Sick-euthyroid syndrome: Secondary | ICD-10-CM | POA: Diagnosis not present

## 2017-07-01 ENCOUNTER — Ambulatory Visit (INDEPENDENT_AMBULATORY_CARE_PROVIDER_SITE_OTHER): Payer: BLUE CROSS/BLUE SHIELD

## 2017-07-01 DIAGNOSIS — E291 Testicular hypofunction: Secondary | ICD-10-CM

## 2017-07-01 MED ORDER — TESTOSTERONE CYPIONATE 200 MG/ML IM SOLN
200.0000 mg | Freq: Once | INTRAMUSCULAR | Status: AC
  Administered 2017-07-01: 200 mg via INTRAMUSCULAR

## 2017-07-01 NOTE — Progress Notes (Signed)
Testosterone IM Injection  Due to Hypogonadism patient is present today for a Testosterone Injection.  Medication: Testosterone Cypionate Dose: 1mL Location: right upper outer buttocks Lot: [id number] Exp: 03/20  Patient tolerated well, no complications were noted  Preformed by: Eligha BridegroomSarah Markus Casten, CMA  Follow up: 2 weeks

## 2017-07-06 DIAGNOSIS — G4733 Obstructive sleep apnea (adult) (pediatric): Secondary | ICD-10-CM | POA: Diagnosis not present

## 2017-07-08 ENCOUNTER — Encounter: Payer: Self-pay | Admitting: Podiatry

## 2017-07-08 ENCOUNTER — Ambulatory Visit (INDEPENDENT_AMBULATORY_CARE_PROVIDER_SITE_OTHER): Payer: BLUE CROSS/BLUE SHIELD | Admitting: Podiatry

## 2017-07-08 ENCOUNTER — Ambulatory Visit (INDEPENDENT_AMBULATORY_CARE_PROVIDER_SITE_OTHER): Payer: BLUE CROSS/BLUE SHIELD

## 2017-07-08 DIAGNOSIS — S93601A Unspecified sprain of right foot, initial encounter: Secondary | ICD-10-CM

## 2017-07-08 DIAGNOSIS — M779 Enthesopathy, unspecified: Secondary | ICD-10-CM | POA: Diagnosis not present

## 2017-07-08 DIAGNOSIS — M7671 Peroneal tendinitis, right leg: Secondary | ICD-10-CM | POA: Diagnosis not present

## 2017-07-08 MED ORDER — MELOXICAM 15 MG PO TABS: 15.0000 mg | ORAL_TABLET | Freq: Every day | ORAL | 3 refills | Status: DC

## 2017-07-08 MED ORDER — METHYLPREDNISOLONE 4 MG PO TBPK: ORAL_TABLET | ORAL | 0 refills | Status: DC

## 2017-07-08 NOTE — Progress Notes (Signed)
   Subjective:    Patient ID: Robert Trevino, male    DOB: 1971/02/10, 46 y.o.   MRN: [id number]  HPI : He presents today with a four-month history of pain across the dorsal aspect of the right foot. He states in going on since May 2018 he denies any injury denies any trauma. States that he has had no swelling he states when he plantar flexes and extends his foot hurts across the top. He gets some burning sensation.   Review of Systems  All other systems reviewed and are negative.      Objective:   Physical Exam: Vital signs are stable alert and oriented 3. Pulses are palpable. Neurologic sensorium is intact. Deep tendon reflexes are intact. Muscle strength is normal. He has pain on plantarflexion at the level of Lisfranc's joints. It is no reproducible calf pain calcaneal pain fascial pain. He has no pain on range of motion of his digits. Radiographs do not demonstrate any type of osseus abnormalities cavus foot type is noted. No open lesions or wounds.        Assessment & Plan:  Assessment: Sprain and tendinitis dorsal aspect of his right foot.  Plan: We will get him into a pair of orthotics he is scheduled with Raiford Noble to be molded. I also started him on a Medrol Dosepak to be followed by meloxicam. I will follow up with him once the orthotics come in.

## 2017-07-14 ENCOUNTER — Ambulatory Visit (INDEPENDENT_AMBULATORY_CARE_PROVIDER_SITE_OTHER): Payer: BLUE CROSS/BLUE SHIELD

## 2017-07-14 DIAGNOSIS — E291 Testicular hypofunction: Secondary | ICD-10-CM | POA: Diagnosis not present

## 2017-07-14 MED ORDER — TESTOSTERONE CYPIONATE 200 MG/ML IM SOLN
200.0000 mg | Freq: Once | INTRAMUSCULAR | Status: AC
  Administered 2017-07-14: 200 mg via INTRAMUSCULAR

## 2017-07-14 NOTE — Progress Notes (Signed)
Testosterone IM Injection  Due to Hypogonadism patient is present today for a Testosterone Injection.  Medication: Testosterone Cypionate Dose: 67mL Location: left upper outer buttocks Lot: [id number] Exp:02/20  Patient tolerated well, no complications were noted  Preformed by: Rupert Stacks, LPN   Follow up: 2 weeks

## 2017-07-29 ENCOUNTER — Ambulatory Visit (INDEPENDENT_AMBULATORY_CARE_PROVIDER_SITE_OTHER): Payer: BLUE CROSS/BLUE SHIELD | Admitting: Orthotics

## 2017-07-29 ENCOUNTER — Ambulatory Visit: Payer: BLUE CROSS/BLUE SHIELD | Admitting: Orthotics

## 2017-07-29 ENCOUNTER — Ambulatory Visit (INDEPENDENT_AMBULATORY_CARE_PROVIDER_SITE_OTHER): Payer: BLUE CROSS/BLUE SHIELD

## 2017-07-29 DIAGNOSIS — E291 Testicular hypofunction: Secondary | ICD-10-CM | POA: Diagnosis not present

## 2017-07-29 DIAGNOSIS — M779 Enthesopathy, unspecified: Secondary | ICD-10-CM

## 2017-07-29 DIAGNOSIS — S93601A Unspecified sprain of right foot, initial encounter: Secondary | ICD-10-CM

## 2017-07-29 MED ORDER — TESTOSTERONE CYPIONATE 200 MG/ML IM SOLN
200.0000 mg | Freq: Once | INTRAMUSCULAR | Status: AC
  Administered 2017-07-29: 200 mg via INTRAMUSCULAR

## 2017-07-29 NOTE — Progress Notes (Signed)
Patient came into today to be cast for Custom Foot Orthotics. Upon recommendation of Dr. Al CorpusHyatt Patient presents with  Goals are Plan vendor

## 2017-07-29 NOTE — Progress Notes (Signed)
Patient presented today for f/o per dr. Al CorpusHyatt.  Patient has pes cavus foot type, supinatus.   Goal is to ground up through arch control, rear foot stability w/ 4* RF and FF valgus wedging.   Richy to fab.

## 2017-07-29 NOTE — Progress Notes (Signed)
Testosterone IM Injection  Due to Hypogonadism patient is present today for a Testosterone Injection.  Medication: Testosterone Cypionate Dose: 1mL Location: right upper outer buttocks Lot: [id number] Exp:02/20  Patient tolerated well, no complications were noted  Preformed by: Eligha BridegroomSarah Amyr Sluder, CMA   Follow up: 2 weeks

## 2017-08-05 ENCOUNTER — Ambulatory Visit: Payer: BLUE CROSS/BLUE SHIELD | Admitting: Podiatry

## 2017-08-05 ENCOUNTER — Other Ambulatory Visit: Payer: BLUE CROSS/BLUE SHIELD | Admitting: Orthotics

## 2017-08-11 ENCOUNTER — Ambulatory Visit (INDEPENDENT_AMBULATORY_CARE_PROVIDER_SITE_OTHER): Payer: BLUE CROSS/BLUE SHIELD

## 2017-08-11 DIAGNOSIS — E291 Testicular hypofunction: Secondary | ICD-10-CM

## 2017-08-11 MED ORDER — TESTOSTERONE CYPIONATE 200 MG/ML IM SOLN
200.0000 mg | Freq: Once | INTRAMUSCULAR | Status: AC
  Administered 2017-08-11: 200 mg via INTRAMUSCULAR

## 2017-08-11 NOTE — Progress Notes (Signed)
Testosterone IM Injection  Due to Hypogonadism patient is present today for a Testosterone Injection.  Medication: Testosterone Cypionate Dose: 1mL Location: left upper outer buttocks Lot: [id number] Exp:12/2018  Patient tolerated well, no complications were noted  Preformed by: Leeroy Bock Layaan Mott, LPN   Follow up: 2 weeks

## 2017-08-12 ENCOUNTER — Ambulatory Visit: Payer: BLUE CROSS/BLUE SHIELD

## 2017-08-19 ENCOUNTER — Ambulatory Visit (INDEPENDENT_AMBULATORY_CARE_PROVIDER_SITE_OTHER): Payer: BLUE CROSS/BLUE SHIELD | Admitting: Orthotics

## 2017-08-19 DIAGNOSIS — S93601A Unspecified sprain of right foot, initial encounter: Secondary | ICD-10-CM

## 2017-08-19 DIAGNOSIS — M779 Enthesopathy, unspecified: Secondary | ICD-10-CM

## 2017-08-19 NOTE — Progress Notes (Signed)
Patient came in today to pick up custom made foot orthotics.  The goals were accomplished and the patient reported no dissatisfaction with said orthotics.  Patient was advised of breakin period and how to report any issues. 

## 2017-08-26 ENCOUNTER — Ambulatory Visit (INDEPENDENT_AMBULATORY_CARE_PROVIDER_SITE_OTHER): Payer: BLUE CROSS/BLUE SHIELD

## 2017-08-26 DIAGNOSIS — E291 Testicular hypofunction: Secondary | ICD-10-CM | POA: Diagnosis not present

## 2017-08-26 MED ORDER — TESTOSTERONE CYPIONATE 200 MG/ML IM SOLN
200.0000 mg | Freq: Once | INTRAMUSCULAR | Status: AC
  Administered 2017-08-26: 200 mg via INTRAMUSCULAR

## 2017-08-26 NOTE — Progress Notes (Signed)
Testosterone IM Injection  Due to Hypogonadism patient is present today for a Testosterone Injection.  Medication: Testosterone Cypionate Dose: 1ml Location: right upper outer buttocks Lot: [id number] Exp:12/2018  Patient tolerated well, no complications were noted  Preformed by: C. Rana Snare, CMA  Follow up: 2 weeks

## 2017-09-09 ENCOUNTER — Ambulatory Visit (INDEPENDENT_AMBULATORY_CARE_PROVIDER_SITE_OTHER): Payer: BLUE CROSS/BLUE SHIELD

## 2017-09-09 DIAGNOSIS — E291 Testicular hypofunction: Secondary | ICD-10-CM | POA: Diagnosis not present

## 2017-09-09 MED ORDER — TESTOSTERONE CYPIONATE 200 MG/ML IM SOLN
200.0000 mg | Freq: Once | INTRAMUSCULAR | Status: AC
  Administered 2017-09-09: 200 mg via INTRAMUSCULAR

## 2017-09-09 NOTE — Progress Notes (Signed)
Testosterone IM Injection  Due to Hypogonadism patient is present today for a Testosterone Injection.  Medication: Testosterone Cypionate Dose: 1 ml Location: left upper outer buttocks Lot: [id number] Exp:03/20  Patient tolerated well, no complications were noted  Preformed by: C.Rana SnareLowe, CMA  Follow up: 2 weeks

## 2017-09-17 ENCOUNTER — Other Ambulatory Visit: Payer: Self-pay | Admitting: Urology

## 2017-09-23 ENCOUNTER — Ambulatory Visit: Payer: BLUE CROSS/BLUE SHIELD

## 2017-09-24 ENCOUNTER — Ambulatory Visit (INDEPENDENT_AMBULATORY_CARE_PROVIDER_SITE_OTHER): Payer: BLUE CROSS/BLUE SHIELD

## 2017-09-24 DIAGNOSIS — E291 Testicular hypofunction: Secondary | ICD-10-CM

## 2017-09-24 MED ORDER — TESTOSTERONE CYPIONATE 200 MG/ML IM SOLN
200.0000 mg | Freq: Once | INTRAMUSCULAR | Status: AC
  Administered 2017-09-24: 200 mg via INTRAMUSCULAR

## 2017-09-24 NOTE — Progress Notes (Signed)
Testosterone IM Injection  Due to Hypogonadism patient is present today for a Testosterone Injection.  Medication: Testosterone Cypionate Dose: 1mL Location: right upper outer buttocks Lot: [id number] Exp:04/2019  Patient tolerated well, no complications were noted  Preformed by: Rupert Stackshelsea Watkins, LPN   Follow up: 2 weeks

## 2017-10-07 ENCOUNTER — Ambulatory Visit (INDEPENDENT_AMBULATORY_CARE_PROVIDER_SITE_OTHER): Payer: BLUE CROSS/BLUE SHIELD

## 2017-10-07 DIAGNOSIS — E291 Testicular hypofunction: Secondary | ICD-10-CM

## 2017-10-07 MED ORDER — TESTOSTERONE CYPIONATE 200 MG/ML IM SOLN
200.0000 mg | Freq: Once | INTRAMUSCULAR | Status: AC
  Administered 2017-10-07: 200 mg via INTRAMUSCULAR

## 2017-10-07 NOTE — Progress Notes (Signed)
Testosterone IM Injection  Due to Hypogonadism patient is present today for a Testosterone Injection.  Medication: Testosterone Cypionate Dose: 1mL Location: left upper outer buttocks Lot: [id number] Exp:04/2019  Patient tolerated well, no complications were noted  Preformed by: Rupert Stackshelsea Chayim Bialas, LPN   Follow up: 2 weeks

## 2017-10-16 DIAGNOSIS — G4733 Obstructive sleep apnea (adult) (pediatric): Secondary | ICD-10-CM | POA: Diagnosis not present

## 2017-10-19 ENCOUNTER — Other Ambulatory Visit: Payer: Self-pay

## 2017-10-19 DIAGNOSIS — N401 Enlarged prostate with lower urinary tract symptoms: Secondary | ICD-10-CM

## 2017-10-19 DIAGNOSIS — E291 Testicular hypofunction: Secondary | ICD-10-CM

## 2017-10-20 ENCOUNTER — Other Ambulatory Visit: Payer: BLUE CROSS/BLUE SHIELD

## 2017-10-20 DIAGNOSIS — N401 Enlarged prostate with lower urinary tract symptoms: Secondary | ICD-10-CM | POA: Diagnosis not present

## 2017-10-20 DIAGNOSIS — E291 Testicular hypofunction: Secondary | ICD-10-CM

## 2017-10-21 LAB — HEMOGLOBIN: HEMOGLOBIN: 17 g/dL (ref 13.0–17.7)

## 2017-10-21 LAB — TESTOSTERONE: TESTOSTERONE: 311 ng/dL (ref 264–916)

## 2017-10-21 LAB — PSA: PROSTATE SPECIFIC AG, SERUM: 0.7 ng/mL (ref 0.0–4.0)

## 2017-10-21 LAB — HEMATOCRIT: HEMATOCRIT: 50.6 % (ref 37.5–51.0)

## 2017-10-22 ENCOUNTER — Encounter: Payer: Self-pay | Admitting: Urology

## 2017-10-22 ENCOUNTER — Telehealth: Payer: Self-pay | Admitting: Urology

## 2017-10-22 ENCOUNTER — Ambulatory Visit (INDEPENDENT_AMBULATORY_CARE_PROVIDER_SITE_OTHER): Payer: BLUE CROSS/BLUE SHIELD | Admitting: Urology

## 2017-10-22 VITALS — BP 164/92 | HR 80 | Ht 76.0 in | Wt 289.7 lb

## 2017-10-22 DIAGNOSIS — N138 Other obstructive and reflux uropathy: Secondary | ICD-10-CM

## 2017-10-22 DIAGNOSIS — E349 Endocrine disorder, unspecified: Secondary | ICD-10-CM | POA: Diagnosis not present

## 2017-10-22 DIAGNOSIS — N401 Enlarged prostate with lower urinary tract symptoms: Secondary | ICD-10-CM | POA: Diagnosis not present

## 2017-10-22 DIAGNOSIS — N529 Male erectile dysfunction, unspecified: Secondary | ICD-10-CM | POA: Diagnosis not present

## 2017-10-22 MED ORDER — TESTOSTERONE CYPIONATE 200 MG/ML IM SOLN
200.0000 mg | Freq: Once | INTRAMUSCULAR | Status: AC
  Administered 2017-10-22: 200 mg via INTRAMUSCULAR

## 2017-10-22 NOTE — Telephone Encounter (Signed)
Would you call in supplies for testosterone injections, he needs the two size needles (18 gauge to draw up the medication and a 21 to inject) to [facility]?

## 2017-10-22 NOTE — Progress Notes (Signed)
IM Injection  Patient is present today for an IM Injection for treatment of Hypogonadism Drug: Testosterone Dose:200mg  Location:Left Upper Outer Quadrant Lot: [id number] Exp:03/2019 Patient tolerated well, no complications were noted  Performed by: Harvel QualeAmber Aztlan Coll, RN

## 2017-10-22 NOTE — Progress Notes (Signed)
10/22/2017 2:41 PM   Robert Trevino 04/18/1971 [id number]  Referring provider: Geoffery Lyonsurner, Eric M, PA [address] [place], [place] [address]  Chief Complaint  Patient presents with  . Hypogonadism    HPI: Patient is a 46 year old Caucasian male with testosterone deficiency, erectile dysfunction and BPH with LU TS who presents today for a 6 months follow up.   Testosterone deficiency Patient is not experiencing a lack of energy, a decrease in strength, sadness and/or grumpiness and a recent deterioration in an ability to play sports.  This is indicated by his responses to the ADAM questionnaire.  He is still having spontaneous erections at night.  He has sleep apnea and is sleeping with a CPAP machine.  His last testosterone level was 311 ng/dL [id number]/2018.  He is currently managing his hypogonadism with testosterone cypionate 200 mg/mL, 1 cc every two weeks.   Androgen Deficiency in the Aging Male    Row Name 10/22/17 1600         Androgen Deficiency in the Aging Male   Do you have a decrease in libido (sex drive)  No     Do you have lack of energy  No     Do you have a decrease in strength and/or endurance  No     Have you lost height  No     Have you noticed a decreased "enjoyment of life"  No     Are you sad and/or grumpy  No     Are your erections less strong  No     Have you noticed a recent deterioration in your ability to play sports  No     Are you falling asleep after dinner  No     Has there been a recent deterioration in your work performance  No        Erectile dysfunction His SHIM score is 24, which is no ED.   His previous SHIM score was 23.  He has been having difficulty with erections for last few years.  His major complaint is getting.  His libido is preserved.   His risk factors for ED are age, BPH, testosterone deficiency and sleep apnea (he sleeps with a CPAP machine).    He denies any painful erections or curvatures with his erections.    SHIM    Row Name  10/22/17 1648         SHIM: Over the last 6 months:   How do you rate your confidence that you could get and keep an erection?  High     When you had erections with sexual stimulation, how often were your erections hard enough for penetration (entering your partner)?  Almost Always or Always     During sexual intercourse, how often were you able to maintain your erection after you had penetrated (entered) your partner?  Almost Always or Always     During sexual intercourse, how difficult was it to maintain your erection to completion of intercourse?  Not Difficult     When you attempted sexual intercourse, how often was it satisfactory for you?  Almost Always or Always       SHIM Total Score   SHIM  24        Score: 1-7 Severe ED 8-11 Moderate ED 12-16 Mild-Moderate ED 17-21 Mild ED 22-25 No ED  BPH WITH LUTS His IPSS score today is 1, which is mild lower urinary tract symptomatology. He is pleased with his quality  life due to his urinary symptoms.  His previous I PSS score was 2/2.    His has no major complaints today.  He denies any dysuria, hematuria or suprapubic pain.      He also denies any recent fevers, chills, nausea or vomiting.  He does not have a family history of PCa  IPSS    Row Name 10/22/17 1600         International Prostate Symptom Score   How often have you had the sensation of not emptying your bladder?  Not at All     How often have you had to urinate less than every two hours?  Not at All     How often have you found you stopped and started again several times when you urinated?  Not at All     How often have you found it difficult to postpone urination?  Not at All     How often have you had a weak urinary stream?  Not at All     How often have you had to strain to start urination?  Not at All     How many times did you typically get up at night to urinate?  1 Time     Total IPSS Score  1       Quality of Life due to urinary symptoms   If you  were to spend the rest of your life with your urinary condition just the way it is now how would you feel about that?  Pleased        Score:  1-7 Mild 8-19 Moderate 20-35 Severe  PMH: Past Medical History:  Diagnosis Date  . Low testosterone   . Sleep apnea     Surgical History: Past Surgical History:  Procedure Laterality Date  . NO PAST SURGERIES     verified with patient 10/22/17    Home Medications:  Allergies as of 10/22/2017   No Known Allergies     Medication List        Accurate as of 10/22/17 11:59 PM. Always use your most recent med list.          fluticasone 50 MCG/ACT nasal spray Commonly known as:  FLONASE Place 2 sprays into both nostrils as needed.   testosterone cypionate 200 MG/ML injection Commonly known as:  DEPOTESTOSTERONE CYPIONATE INJECT 1ML INTRAMUSCULARLY EVERY 2 WEEKS       Allergies: No Known Allergies  Family History: Family History  Problem Relation Age of Onset  . Stroke Father   . Diabetes Mother   . Healthy Daughter   . Kidney disease Neg Hx   . Prostate cancer Neg Hx   . Kidney cancer Neg Hx   . Bladder Cancer Neg Hx     Social History:  reports that  has never smoked. he has never used smokeless tobacco. He reports that he drinks alcohol. He reports that he does not use drugs.  ROS: UROLOGY Frequent Urination?: No Hard to postpone urination?: No Burning/pain with urination?: No Get up at night to urinate?: No Leakage of urine?: No Urine stream starts and stops?: No Trouble starting stream?: No Do you have to strain to urinate?: No Blood in urine?: No Urinary tract infection?: No Sexually transmitted disease?: No Injury to kidneys or bladder?: No Painful intercourse?: No Weak stream?: No Erection problems?: No Penile pain?: No  Gastrointestinal Nausea?: No Vomiting?: No Indigestion/heartburn?: No Diarrhea?: No Constipation?: No  Constitutional Fever: No Night sweats?: No Weight loss?:  No Fatigue?: No  Skin Skin rash/lesions?: No Itching?: No  Eyes Blurred vision?: No Double vision?: No  Ears/Nose/Throat Sore throat?: No Sinus problems?: No  Hematologic/Lymphatic Swollen glands?: No Easy bruising?: No  Cardiovascular Leg swelling?: No Chest pain?: No  Respiratory Cough?: No Shortness of breath?: No  Endocrine Excessive thirst?: No  Musculoskeletal Back pain?: No Joint pain?: No  Neurological Headaches?: No Dizziness?: No  Psychologic Depression?: No Anxiety?: No  Physical Exam: BP (!) 164/92   Pulse 80   Ht 6\' 4"  (1.93 m)   Wt 289 lb 11.2 oz (131.4 kg)   BMI 35.26 kg/m   Constitutional: Well nourished. Alert and oriented, No acute distress. HEENT: [place] AT, moist mucus membranes. Trachea midline, no masses. Cardiovascular: No clubbing, cyanosis, or edema. Respiratory: Normal respiratory effort, no increased work of breathing. GI: Abdomen is soft, non tender, non distended, no abdominal masses. Liver and spleen not palpable.  No hernias appreciated.  Stool sample for occult testing is not indicated.   GU: No CVA tenderness.  No bladder fullness or masses.  Patient with circumcised phallus.   Urethral meatus is patent.  No penile discharge. No penile lesions or rashes. Scrotum without lesions, cysts, rashes and/or edema.  Testicles are located scrotally bilaterally. No masses are appreciated in the testicles. Left and right epididymis are normal. Rectal: Patient with  normal sphincter tone. Anus and perineum without scarring or rashes. No rectal masses are appreciated. Prostate is approximately 50 grams, no nodules are appreciated. Seminal vesicles are normal. Skin: No rashes, bruises or suspicious lesions. Lymph: No cervical or inguinal adenopathy. Neurologic: Grossly intact, no focal deficits, moving all 4 extremities. Psychiatric: Normal mood and affect.  Laboratory Data: Lab Results  Component Value Date   HGB 17.0 10/20/2017   HCT  50.6 10/20/2017   PSA History  0.7 ng/mL on 05/01/2016  0.6 ng/mLon 12/16/2016  0.7 ng/mL in 10/2017   Lab Results  Component Value Date   TESTOSTERONE 311 10/20/2017    Assessment & Plan:    1. Testosterone deficiency   -most recent testosterone level is 311 ng/dL on 62/13/0865  -continue testosterone cypionate 200 mg/mL , 1 cc IM every two weeks - will recheck his testosterone levels one week after this injection he received today  -RTC in 6 months for HCT/HBG, testosterone, ADAM and exam   2. BPH with LUTS  - IPSS score is 1/1, it is improving  - Continue conservative management, avoiding bladder irritants and timed voiding's  - RTC in 6 months for IPSS, PSA and exam, as testosterone therapy can cause prostate enlargement and worsen LUTS   3. Erectile dysfunction:     -SHIM score is 24  -RTC in 6 months for SHIM score and exam, as testosterone therapy can affect erections   Return in about 1 week (around 10/29/2017) for testosterone level only.  These notes generated with voice recognition software. I apologize for typographical errors.  Michiel Cowboy, PA-C  [facility] [address], [address] [place], [place] [address] [phone]

## 2017-10-28 ENCOUNTER — Other Ambulatory Visit: Payer: Self-pay

## 2017-10-28 DIAGNOSIS — E349 Endocrine disorder, unspecified: Secondary | ICD-10-CM

## 2017-10-28 NOTE — Telephone Encounter (Signed)
Supplies called in to pharmacy

## 2017-10-29 ENCOUNTER — Other Ambulatory Visit: Payer: BLUE CROSS/BLUE SHIELD

## 2017-10-29 DIAGNOSIS — E349 Endocrine disorder, unspecified: Secondary | ICD-10-CM | POA: Diagnosis not present

## 2017-10-30 ENCOUNTER — Telehealth: Payer: Self-pay

## 2017-10-30 LAB — TESTOSTERONE: Testosterone: 1126 ng/dL — ABNORMAL HIGH (ref 264–916)

## 2017-10-30 NOTE — Telephone Encounter (Signed)
LMOM- testosterone to high. Needs to lower dose to 0.75cc. And recheck labs after 4th injection.

## 2017-10-30 NOTE — Telephone Encounter (Signed)
-----   Message from Harle BattiestShannon A McGowan, PA-C sent at 10/30/2017  7:48 AM EST ----- Please let Brain know that his testosterone level is high.  We need to decrease his injection amount to 0.75 cc q week and recheck his testosterone one week after his fourth injection.

## 2017-11-02 ENCOUNTER — Telehealth: Payer: Self-pay | Admitting: Urology

## 2017-11-02 NOTE — Telephone Encounter (Signed)
Pt called office stating he was returning your call, asks that you call him back. Thanks.

## 2017-11-03 NOTE — Telephone Encounter (Signed)
LMOM

## 2017-11-04 ENCOUNTER — Ambulatory Visit (INDEPENDENT_AMBULATORY_CARE_PROVIDER_SITE_OTHER): Payer: BLUE CROSS/BLUE SHIELD

## 2017-11-04 DIAGNOSIS — E291 Testicular hypofunction: Secondary | ICD-10-CM

## 2017-11-04 MED ORDER — TESTOSTERONE CYPIONATE 200 MG/ML IM SOLN
200.0000 mg | Freq: Once | INTRAMUSCULAR | Status: AC
  Administered 2017-11-04: 200 mg via INTRAMUSCULAR

## 2017-11-04 NOTE — Telephone Encounter (Signed)
Spoke to pt at nurse visit.

## 2017-11-04 NOTE — Progress Notes (Signed)
Testosterone IM Injection  Due to Hypogonadism patient is present today for a Testosterone Injection.  Medication: Testosterone Cypionate Dose: 0.6175mL Location: left upper outer buttocks Lot: [id number] Exp:03/2019  Patient tolerated well, no complications were noted  Preformed by: Rupert Stackshelsea Daryn Hicks, LPN   Follow up: pt will have labs in 9 weeks to recheck testosterone levels and OV in 75mo.

## 2018-01-06 ENCOUNTER — Other Ambulatory Visit: Payer: Self-pay

## 2018-01-06 ENCOUNTER — Other Ambulatory Visit: Payer: BLUE CROSS/BLUE SHIELD

## 2018-01-06 DIAGNOSIS — E291 Testicular hypofunction: Secondary | ICD-10-CM

## 2018-01-07 ENCOUNTER — Telehealth: Payer: Self-pay

## 2018-01-07 DIAGNOSIS — E291 Testicular hypofunction: Secondary | ICD-10-CM

## 2018-01-07 LAB — TESTOSTERONE: TESTOSTERONE: 860 ng/dL (ref 264–916)

## 2018-01-07 NOTE — Telephone Encounter (Signed)
-----   Message from Harle BattiestShannon A McGowan, PA-C sent at 01/07/2018  7:47 AM EST ----- Please let Robert Trevino know that his testosterone is now in the normal range.   I would like his levels checked again in April with a Hbg and HCT.  Blood should be drawn one week after an injection.

## 2018-01-07 NOTE — Telephone Encounter (Signed)
Letter sent.

## 2018-01-13 ENCOUNTER — Other Ambulatory Visit: Payer: BLUE CROSS/BLUE SHIELD

## 2018-01-13 ENCOUNTER — Telehealth: Payer: Self-pay | Admitting: Urology

## 2018-01-13 NOTE — Telephone Encounter (Signed)
I do want him to return in April.  He had an elevated testosterone in December and it is still above 700.  We need to keep a closer eye on his levels.

## 2018-01-13 NOTE — Telephone Encounter (Signed)
Pt called office confused about letter sent to him about making an April appt for labs.  Pt states he already has lab appt for May and [facility].  Pt feels April labs will be too soon.  Please advise. Thanks.

## 2018-01-14 ENCOUNTER — Telehealth: Payer: Self-pay | Admitting: Urology

## 2018-01-14 NOTE — Telephone Encounter (Signed)
Pt states he only has 2 vials left, there are no refills on file at his pharmacy, asked to please let Robert Trevino know so that this can be addressed before he runs out. Please advise. Thanks.

## 2018-01-15 MED ORDER — TESTOSTERONE CYPIONATE 200 MG/ML IM SOLN: INTRAMUSCULAR | 0 refills | Status: DC

## 2018-01-15 NOTE — Telephone Encounter (Signed)
That is fine to refill his testosterone.

## 2018-01-15 NOTE — Telephone Encounter (Signed)
Medication sent to pt pharmacy 

## 2018-01-25 ENCOUNTER — Ambulatory Visit: Payer: BLUE CROSS/BLUE SHIELD | Admitting: Nurse Practitioner

## 2018-01-25 ENCOUNTER — Encounter: Payer: Self-pay | Admitting: Nurse Practitioner

## 2018-01-25 VITALS — BP 140/84 | HR 87 | Resp 16 | Ht 76.0 in | Wt 294.0 lb

## 2018-01-25 DIAGNOSIS — M79671 Pain in right foot: Secondary | ICD-10-CM

## 2018-01-25 DIAGNOSIS — L03116 Cellulitis of left lower limb: Secondary | ICD-10-CM | POA: Diagnosis not present

## 2018-01-25 DIAGNOSIS — L02416 Cutaneous abscess of left lower limb: Secondary | ICD-10-CM

## 2018-01-25 MED ORDER — MUPIROCIN 2 % EX OINT: 1.0000 "application " | TOPICAL_OINTMENT | Freq: Two times a day (BID) | CUTANEOUS | 2 refills | Status: DC

## 2018-01-25 MED ORDER — PREDNISONE 10 MG (21) PO TBPK: ORAL_TABLET | ORAL | 0 refills | Status: DC

## 2018-01-25 MED ORDER — TRIAMCINOLONE ACETONIDE 0.025 % EX CREA: 1.0000 "application " | TOPICAL_CREAM | Freq: Two times a day (BID) | CUTANEOUS | 2 refills | Status: DC

## 2018-01-25 MED ORDER — SULFAMETHOXAZOLE-TRIMETHOPRIM 800-160 MG PO TABS: 1.0000 | ORAL_TABLET | Freq: Two times a day (BID) | ORAL | 0 refills | Status: DC

## 2018-01-25 NOTE — Progress Notes (Signed)
[facility] [address] [place], [place] [address]  Internal MEDICINE  Office Visit Note  Patient Name: Robert ReeBruce B Hipple  [id number]  [id number]  Date of Service: 02/14/2018  Chief Complaint  Patient presents with  . Rash    on left leg      The patient is here for sick visit. Today, he has significant rash on left lower leg. This is red, warm, and very tender to palpate. There are a few areas that are starting to open and there is small amount of clear drainage present.   Pt is here for a sick visit.     Current Medication:  Outpatient Encounter Medications as of 01/25/2018  Medication Sig  . fluticasone (FLONASE) 50 MCG/ACT nasal spray Place 2 sprays into both nostrils as needed.   . testosterone cypionate (DEPOTESTOSTERONE CYPIONATE) 200 MG/ML injection INJECT 1ML INTRAMUSCULARLY EVERY 2 WEEKS  . mupirocin ointment (BACTROBAN) 2 % Place 1 application into the nose 2 (two) times daily.  . predniSONE (STERAPRED UNI-PAK 21 TAB) 10 MG (21) TBPK tablet 6 day taper - take by mouth as directed for 6 days  . sulfamethoxazole-trimethoprim (BACTRIM DS,SEPTRA DS) 800-160 MG tablet Take 1 tablet by mouth 2 (two) times daily.  Marland Kitchen. triamcinolone (KENALOG) 0.025 % cream Apply 1 application topically 2 (two) times daily.   No facility-administered encounter medications on file as of 01/25/2018.       Medical History: Past Medical History:  Diagnosis Date  . Low testosterone   . Sleep apnea      Today's Vitals   01/25/18 1550  BP: 140/84  Pulse: 87  Resp: 16  SpO2: 98%  Weight: 294 lb (133.4 kg)  Height: 6\' 4"  (1.93 m)    Review of Systems  Constitutional: Negative for chills, fatigue and unexpected weight change.  HENT: Negative for congestion, postnasal drip, rhinorrhea, sneezing and sore throat.   Eyes: Negative.  Negative for redness.  Respiratory: Negative for cough, chest tightness, shortness of breath and wheezing.   Cardiovascular: Positive for leg  swelling. Negative for chest pain and palpitations.  Gastrointestinal: Negative for abdominal pain, constipation, diarrhea, nausea and vomiting.  Genitourinary: Negative for dysuria and frequency.  Musculoskeletal: Negative for arthralgias, back pain, joint swelling and neck pain.  Skin: Positive for rash.       Left lower leg pain with swelling and pain present.   Neurological: Negative.  Negative for tremors and numbness.  Hematological: Negative for adenopathy. Does not bruise/bleed easily.  Psychiatric/Behavioral: Negative for behavioral problems (Depression), sleep disturbance and suicidal ideas. The patient is not nervous/anxious.     Physical Exam  Constitutional: He is oriented to person, place, and time. He appears well-developed and well-nourished. No distress.  HENT:  Head: Normocephalic and atraumatic.  Mouth/Throat: Oropharynx is clear and moist. No oropharyngeal exudate.  Eyes: Pupils are equal, round, and reactive to light. EOM are normal.  Neck: Normal range of motion. Neck supple. No JVD present. No tracheal deviation present. No thyromegaly present.  Cardiovascular: Normal rate, regular rhythm and normal heart sounds. Exam reveals no gallop and no friction rub.  No murmur heard. Pulmonary/Chest: Effort normal. No respiratory distress. He has no wheezes. He has no rales. He exhibits no tenderness.  Abdominal: Soft. Bowel sounds are normal. There is no tenderness.  Musculoskeletal: Normal range of motion.  Lymphadenopathy:    He has no cervical adenopathy.  Neurological: He is alert and oriented to person, place, and time. No cranial nerve deficit.  Skin: Skin is warm and dry. Rash noted. He is not diaphoretic.  There is moderate redness, tenderness, and swelling present in left lower leg. There are a few areas where skin has opened and there is small amount of clear fluid draining.   Psychiatric: He has a normal mood and affect. His behavior is normal. Judgment and  thought content normal.  Nursing note and vitals reviewed.  Assessment/Plan: 1. Cellulitis and abscess of left lower extremity - sulfamethoxazole-trimethoprim (BACTRIM DS,SEPTRA DS) 800-160 MG tablet; Take 1 tablet by mouth 2 (two) times daily.  Dispense: 28 tablet; Refill: 0 - triamcinolone (KENALOG) 0.025 % cream; Apply 1 application topically 2 (two) times daily.  Dispense: 80 g; Refill: 2 - mupirocin ointment (BACTROBAN) 2 %; Place 1 application into the nose 2 (two) times daily.  Dispense: 22 g; Refill: 2  2. Pain in right foot - predniSONE (STERAPRED UNI-PAK 21 TAB) 10 MG (21) TBPK tablet; 6 day taper - take by mouth as directed for 6 days  Dispense: 21 tablet; Refill: 0  General Counseling: Tarek verbalizes understanding of the findings of todays visit and agrees with plan of treatment. I have discussed any further diagnostic evaluation that may be needed or ordered today. We also reviewed his medications today. he has been encouraged to call the office with any questions or concerns that should arise related to todays visit.    This patient was seen by Vincent Gros, FNP- C in Collaboration with Dr Lyndon Code as a part of collaborative care agreement     Meds ordered this encounter  Medications  . sulfamethoxazole-trimethoprim (BACTRIM DS,SEPTRA DS) 800-160 MG tablet    Sig: Take 1 tablet by mouth 2 (two) times daily.    Dispense:  28 tablet    Refill:  0    Order Specific Question:   Supervising Provider    Answer:   Lyndon Code [[id number]]  . triamcinolone (KENALOG) 0.025 % cream    Sig: Apply 1 application topically 2 (two) times daily.    Dispense:  80 g    Refill:  2    Order Specific Question:   Supervising Provider    Answer:   Lyndon Code [[id number]]  . mupirocin ointment (BACTROBAN) 2 %    Sig: Place 1 application into the nose 2 (two) times daily.    Dispense:  22 g    Refill:  2    Order Specific Question:   Supervising Provider    Answer:   Lyndon Code [[id number]]   . predniSONE (STERAPRED UNI-PAK 21 TAB) 10 MG (21) TBPK tablet    Sig: 6 day taper - take by mouth as directed for 6 days    Dispense:  21 tablet    Refill:  0    Order Specific Question:   Supervising Provider    Answer:   Lyndon Code [[id number]]    Time spent: 15  Minutes

## 2018-01-27 DIAGNOSIS — G4733 Obstructive sleep apnea (adult) (pediatric): Secondary | ICD-10-CM | POA: Diagnosis not present

## 2018-02-02 DIAGNOSIS — R0602 Shortness of breath: Secondary | ICD-10-CM | POA: Diagnosis not present

## 2018-02-02 DIAGNOSIS — R9431 Abnormal electrocardiogram [ECG] [EKG]: Secondary | ICD-10-CM | POA: Diagnosis not present

## 2018-02-02 DIAGNOSIS — R079 Chest pain, unspecified: Secondary | ICD-10-CM | POA: Diagnosis not present

## 2018-02-02 DIAGNOSIS — I209 Angina pectoris, unspecified: Secondary | ICD-10-CM | POA: Diagnosis not present

## 2018-02-02 DIAGNOSIS — I2510 Atherosclerotic heart disease of native coronary artery without angina pectoris: Secondary | ICD-10-CM | POA: Diagnosis not present

## 2018-02-03 DIAGNOSIS — R079 Chest pain, unspecified: Secondary | ICD-10-CM | POA: Diagnosis not present

## 2018-02-05 DIAGNOSIS — I2510 Atherosclerotic heart disease of native coronary artery without angina pectoris: Secondary | ICD-10-CM | POA: Diagnosis not present

## 2018-02-05 DIAGNOSIS — R0602 Shortness of breath: Secondary | ICD-10-CM | POA: Diagnosis not present

## 2018-02-05 DIAGNOSIS — R079 Chest pain, unspecified: Secondary | ICD-10-CM | POA: Diagnosis not present

## 2018-02-05 DIAGNOSIS — I10 Essential (primary) hypertension: Secondary | ICD-10-CM | POA: Diagnosis not present

## 2018-02-08 DIAGNOSIS — I10 Essential (primary) hypertension: Secondary | ICD-10-CM | POA: Diagnosis not present

## 2018-02-08 DIAGNOSIS — I2510 Atherosclerotic heart disease of native coronary artery without angina pectoris: Secondary | ICD-10-CM | POA: Diagnosis not present

## 2018-02-08 DIAGNOSIS — R0602 Shortness of breath: Secondary | ICD-10-CM | POA: Diagnosis not present

## 2018-02-08 DIAGNOSIS — R079 Chest pain, unspecified: Secondary | ICD-10-CM | POA: Diagnosis not present

## 2018-02-09 DIAGNOSIS — R072 Precordial pain: Secondary | ICD-10-CM | POA: Diagnosis not present

## 2018-02-14 DIAGNOSIS — L02416 Cutaneous abscess of left lower limb: Secondary | ICD-10-CM | POA: Insufficient documentation

## 2018-02-14 DIAGNOSIS — L03116 Cellulitis of left lower limb: Principal | ICD-10-CM

## 2018-02-14 DIAGNOSIS — M79671 Pain in right foot: Secondary | ICD-10-CM | POA: Insufficient documentation

## 2018-02-15 DIAGNOSIS — E785 Hyperlipidemia, unspecified: Secondary | ICD-10-CM | POA: Diagnosis not present

## 2018-02-15 DIAGNOSIS — I2510 Atherosclerotic heart disease of native coronary artery without angina pectoris: Secondary | ICD-10-CM | POA: Diagnosis not present

## 2018-02-15 DIAGNOSIS — R079 Chest pain, unspecified: Secondary | ICD-10-CM | POA: Diagnosis not present

## 2018-02-15 DIAGNOSIS — I10 Essential (primary) hypertension: Secondary | ICD-10-CM | POA: Diagnosis not present

## 2018-03-03 ENCOUNTER — Other Ambulatory Visit: Payer: BLUE CROSS/BLUE SHIELD

## 2018-03-03 DIAGNOSIS — E291 Testicular hypofunction: Secondary | ICD-10-CM

## 2018-03-03 DIAGNOSIS — M5412 Radiculopathy, cervical region: Secondary | ICD-10-CM | POA: Diagnosis not present

## 2018-03-03 DIAGNOSIS — M9902 Segmental and somatic dysfunction of thoracic region: Secondary | ICD-10-CM | POA: Diagnosis not present

## 2018-03-03 DIAGNOSIS — M5414 Radiculopathy, thoracic region: Secondary | ICD-10-CM | POA: Diagnosis not present

## 2018-03-03 DIAGNOSIS — M9901 Segmental and somatic dysfunction of cervical region: Secondary | ICD-10-CM | POA: Diagnosis not present

## 2018-03-04 ENCOUNTER — Telehealth: Payer: Self-pay

## 2018-03-04 DIAGNOSIS — M5412 Radiculopathy, cervical region: Secondary | ICD-10-CM | POA: Diagnosis not present

## 2018-03-04 DIAGNOSIS — M5414 Radiculopathy, thoracic region: Secondary | ICD-10-CM | POA: Diagnosis not present

## 2018-03-04 DIAGNOSIS — M9902 Segmental and somatic dysfunction of thoracic region: Secondary | ICD-10-CM | POA: Diagnosis not present

## 2018-03-04 DIAGNOSIS — M9901 Segmental and somatic dysfunction of cervical region: Secondary | ICD-10-CM | POA: Diagnosis not present

## 2018-03-04 LAB — HEMOGLOBIN: HEMOGLOBIN: 15.7 g/dL (ref 13.0–17.7)

## 2018-03-04 LAB — TESTOSTERONE: Testosterone: 1002 ng/dL — ABNORMAL HIGH (ref 264–916)

## 2018-03-04 LAB — HEMATOCRIT: Hematocrit: 46.1 % (ref 37.5–51.0)

## 2018-03-04 NOTE — Telephone Encounter (Signed)
-----   Message from Harle BattiestShannon A McGowan, PA-C sent at 03/04/2018  7:58 AM EDT ----- Robert Trevino testosterone is high.  He needs to reduce his dose to 0.5 cc every 2 weeks.  We will need to recheck his testosterone one week after his fourth injection along with his HCT and Hbg.

## 2018-03-08 DIAGNOSIS — M5414 Radiculopathy, thoracic region: Secondary | ICD-10-CM | POA: Diagnosis not present

## 2018-03-08 DIAGNOSIS — M9902 Segmental and somatic dysfunction of thoracic region: Secondary | ICD-10-CM | POA: Diagnosis not present

## 2018-03-08 DIAGNOSIS — M5412 Radiculopathy, cervical region: Secondary | ICD-10-CM | POA: Diagnosis not present

## 2018-03-08 DIAGNOSIS — M9901 Segmental and somatic dysfunction of cervical region: Secondary | ICD-10-CM | POA: Diagnosis not present

## 2018-03-10 DIAGNOSIS — M5412 Radiculopathy, cervical region: Secondary | ICD-10-CM | POA: Diagnosis not present

## 2018-03-10 DIAGNOSIS — M5414 Radiculopathy, thoracic region: Secondary | ICD-10-CM | POA: Diagnosis not present

## 2018-03-10 DIAGNOSIS — M9901 Segmental and somatic dysfunction of cervical region: Secondary | ICD-10-CM | POA: Diagnosis not present

## 2018-03-10 DIAGNOSIS — M9902 Segmental and somatic dysfunction of thoracic region: Secondary | ICD-10-CM | POA: Diagnosis not present

## 2018-03-12 DIAGNOSIS — M5412 Radiculopathy, cervical region: Secondary | ICD-10-CM | POA: Diagnosis not present

## 2018-03-12 DIAGNOSIS — M9902 Segmental and somatic dysfunction of thoracic region: Secondary | ICD-10-CM | POA: Diagnosis not present

## 2018-03-12 DIAGNOSIS — M5414 Radiculopathy, thoracic region: Secondary | ICD-10-CM | POA: Diagnosis not present

## 2018-03-12 DIAGNOSIS — M9901 Segmental and somatic dysfunction of cervical region: Secondary | ICD-10-CM | POA: Diagnosis not present

## 2018-04-02 DIAGNOSIS — R600 Localized edema: Secondary | ICD-10-CM | POA: Diagnosis not present

## 2018-04-02 DIAGNOSIS — I8310 Varicose veins of unspecified lower extremity with inflammation: Secondary | ICD-10-CM | POA: Diagnosis not present

## 2018-04-02 DIAGNOSIS — L308 Other specified dermatitis: Secondary | ICD-10-CM | POA: Diagnosis not present

## 2018-04-07 ENCOUNTER — Other Ambulatory Visit: Payer: BLUE CROSS/BLUE SHIELD

## 2018-04-14 ENCOUNTER — Other Ambulatory Visit: Payer: Self-pay | Admitting: Family Medicine

## 2018-04-14 ENCOUNTER — Other Ambulatory Visit: Payer: BLUE CROSS/BLUE SHIELD

## 2018-04-14 DIAGNOSIS — E291 Testicular hypofunction: Secondary | ICD-10-CM | POA: Diagnosis not present

## 2018-04-15 ENCOUNTER — Other Ambulatory Visit: Payer: Self-pay | Admitting: Urology

## 2018-04-15 LAB — HEMOGLOBIN: HEMOGLOBIN: 15.4 g/dL (ref 13.0–17.7)

## 2018-04-15 LAB — TESTOSTERONE: Testosterone: 791 ng/dL (ref 264–916)

## 2018-04-15 LAB — HEMATOCRIT: HEMATOCRIT: 45.2 % (ref 37.5–51.0)

## 2018-04-17 LAB — SPECIMEN STATUS REPORT

## 2018-04-17 LAB — PSA: PROSTATE SPECIFIC AG, SERUM: 0.7 ng/mL (ref 0.0–4.0)

## 2018-04-21 DIAGNOSIS — L281 Prurigo nodularis: Secondary | ICD-10-CM | POA: Diagnosis not present

## 2018-04-21 DIAGNOSIS — R600 Localized edema: Secondary | ICD-10-CM | POA: Diagnosis not present

## 2018-04-21 DIAGNOSIS — I8312 Varicose veins of left lower extremity with inflammation: Secondary | ICD-10-CM | POA: Diagnosis not present

## 2018-05-02 NOTE — Progress Notes (Signed)
05/05/2018 4:23 PM   Robert Trevino 1971/08/07 [id number]  Referring provider: Geoffery Lyons, PA [address] [place], [place] [address]  Chief Complaint  Patient presents with  . Hypogonadism    HPI: Patient is a 47 year old Caucasian male with testosterone deficiency, erectile dysfunction and BPH with LU TS who presents today for a 6 months follow up.   Testosterone deficiency He is still having spontaneous erections at night.  He has sleep apnea and is sleeping with a CPAP machine.  His last testosterone level was 311 ng/dL in 78/4696.  He is currently managing his testosterone deficiency with testosterone cypionate 200 mg/mL, 0.75 cc every two weeks.  His testosterone level is 791 ng/dL on 29/52/8413.  His HCT is 45.2%.  His Hbg is 15.4.     Erectile dysfunction His SHIM score is 20, which is mild ED.   His previous SHIM score was 24.  He has been having difficulty with erections for last few years.  His major complaint is getting.  His libido is preserved.   His risk factors for ED are age, BPH, testosterone deficiency and sleep apnea (he sleeps with a CPAP machine).    He denies any painful erections or curvatures with his erections.    SHIM    Row Name 05/05/18 1615         SHIM: Over the last 6 months:   How do you rate your confidence that you could get and keep an erection?  Moderate     When you had erections with sexual stimulation, how often were your erections hard enough for penetration (entering your partner)?  Most Times (much more than half the time)     During sexual intercourse, how often were you able to maintain your erection after you had penetrated (entered) your partner?  Most Times (much more than half the time)     During sexual intercourse, how difficult was it to maintain your erection to completion of intercourse?  Not Difficult     When you attempted sexual intercourse, how often was it satisfactory for you?  Most Times (much more than half the time)         SHIM Total Score   SHIM  20        Score: 1-7 Severe ED 8-11 Moderate ED 12-16 Mild-Moderate ED 17-21 Mild ED 22-25 No ED  BPH WITH LUTS His IPSS score today is 3, which is mild lower urinary tract symptomatology. He is pleased with his quality life due to his urinary symptoms.  His previous I PSS score was 1/1.      His has no major complaints today.  He denies any dysuria, hematuria or suprapubic pain.      He also denies any recent fevers, chills, nausea or vomiting.  He does not have a family history of PCa   IPSS    Row Name 05/05/18 1600         International Prostate Symptom Score   How often have you had the sensation of not emptying your bladder?  Not at All     How often have you had to urinate less than every two hours?  Less than 1 in 5 times     How often have you found you stopped and started again several times when you urinated?  Not at All     How often have you found it difficult to postpone urination?  Less than 1 in 5 times  How often have you had a weak urinary stream?  Not at All     How often have you had to strain to start urination?  Not at All     How many times did you typically get up at night to urinate?  1 Time     Total IPSS Score  3       Quality of Life due to urinary symptoms   If you were to spend the rest of your life with your urinary condition just the way it is now how would you feel about that?  Pleased        Score:  1-7 Mild 8-19 Moderate 20-35 Severe  PMH: Past Medical History:  Diagnosis Date  . Low testosterone   . Sleep apnea     Surgical History: Past Surgical History:  Procedure Laterality Date  . NO PAST SURGERIES     verified with patient 10/22/17    Home Medications:  Allergies as of 05/05/2018   No Known Allergies     Medication List        Accurate as of 05/05/18  4:23 PM. Always use your most recent med list.          fluticasone 50 MCG/ACT nasal spray Commonly known as:   FLONASE Place 2 sprays into both nostrils as needed.   mupirocin ointment 2 % Commonly known as:  BACTROBAN Place 1 application into the nose 2 (two) times daily.   rosuvastatin 20 MG tablet Commonly known as:  CRESTOR   testosterone cypionate 200 MG/ML injection Commonly known as:  DEPOTESTOSTERONE CYPIONATE INJECT 1ML INTRAMUSCULARLY EVERY 2 WEEKS       Allergies: No Known Allergies  Family History: Family History  Problem Relation Age of Onset  . Stroke Father   . Diabetes Mother   . Healthy Daughter   . Kidney disease Neg Hx   . Prostate cancer Neg Hx   . Kidney cancer Neg Hx   . Bladder Cancer Neg Hx     Social History:  reports that he has never smoked. He has never used smokeless tobacco. He reports that he drinks alcohol. He reports that he does not use drugs.  ROS: UROLOGY Frequent Urination?: No Hard to postpone urination?: No Burning/pain with urination?: No Get up at night to urinate?: No Leakage of urine?: No Urine stream starts and stops?: No Trouble starting stream?: No Do you have to strain to urinate?: No Blood in urine?: No Urinary tract infection?: No Sexually transmitted disease?: No Injury to kidneys or bladder?: No Painful intercourse?: No Weak stream?: No Erection problems?: No Penile pain?: No  Gastrointestinal Nausea?: No Vomiting?: No Indigestion/heartburn?: No Diarrhea?: No Constipation?: No  Constitutional Fever: No Night sweats?: No Weight loss?: No Fatigue?: No  Skin Skin rash/lesions?: No Itching?: No  Eyes Blurred vision?: No Double vision?: No  Ears/Nose/Throat Sore throat?: No Sinus problems?: No  Hematologic/Lymphatic Swollen glands?: No Easy bruising?: No  Cardiovascular Leg swelling?: No Chest pain?: No  Respiratory Cough?: No Shortness of breath?: No  Endocrine Excessive thirst?: No  Musculoskeletal Back pain?: No Joint pain?: No  Neurological Headaches?: No Dizziness?:  No  Psychologic Depression?: No Anxiety?: No  Physical Exam: BP 134/75 (BP Location: Right Arm, Patient Position: Sitting, Cuff Size: Large)   Pulse 60   Ht 6\' 4"  (1.93 m)   Wt 282 lb 11.2 oz (128.2 kg)   BMI 34.41 kg/m   Constitutional: Well nourished. Alert and oriented, No acute distress. HEENT:  [place] AT, moist mucus membranes. Trachea midline, no masses. Cardiovascular: No clubbing, cyanosis, or edema. Respiratory: Normal respiratory effort, no increased work of breathing. GI: Abdomen is soft, non tender, non distended, no abdominal masses. Liver and spleen not palpable.  No hernias appreciated.  Stool sample for occult testing is not indicated.   GU: No CVA tenderness.  No bladder fullness or masses.  Patient with circumcised phallus.  Urethral meatus is patent.  No penile discharge. No penile lesions or rashes. Scrotum without lesions, cysts, rashes and/or edema.  Testicles are located scrotally bilaterally. No masses are appreciated in the testicles. Left and right epididymis are normal. Rectal: Patient with  normal sphincter tone. Anus and perineum without scarring or rashes. No rectal masses are appreciated. Prostate is approximately 50 grams, no nodules are appreciated. Seminal vesicles are normal. Skin: No rashes, bruises or suspicious lesions. Lymph: No cervical or inguinal adenopathy. Neurologic: Grossly intact, no focal deficits, moving all 4 extremities. Psychiatric: Normal mood and affect.   Laboratory Data: Lab Results  Component Value Date   HGB 15.4 04/14/2018   HCT 45.2 04/14/2018   PSA History  0.7 ng/mL on 05/01/2016  0.6 ng/mLon 12/16/2016  0.7 ng/mL in 10/2017  0.7 ng/mL in 03/2018   Lab Results  Component Value Date   TESTOSTERONE 791 04/14/2018   I have reviewed the labs.  Assessment & Plan:    1. Testosterone deficiency   -most recent testosterone level was 791 ng/dL in 16/1096 (therapeutic levels 450-600)  -continue testosterone cypionate  200 mg/mL , 0.75 cc IM every two weeks   -RTC in 3 months for HCT/HBG and testosterone (one week after an injection)    2. BPH with LUTS  - IPSS score is 3/1, it is slightly worse  - Continue conservative management, avoiding bladder irritants and timed voiding's  - RTC in 6 months for IPSS, PSA and exam, as testosterone therapy can cause prostate enlargement and worsen LUTS   3. Erectile dysfunction:     -SHIM score is 20, it is slightly worse  -RTC in 6 months for SHIM score and exam, as testosterone therapy can affect erections   Return in about 3 months (around 08/05/2018) for  testosterone (one week after injection) H & H.  These notes generated with voice recognition software. I apologize for typographical errors.  Michiel Cowboy, PA-C  [facility] [address] [place], [place] [address] [phone]

## 2018-05-05 ENCOUNTER — Encounter: Payer: Self-pay | Admitting: Urology

## 2018-05-05 ENCOUNTER — Ambulatory Visit: Payer: BLUE CROSS/BLUE SHIELD | Admitting: Urology

## 2018-05-05 VITALS — BP 134/75 | HR 60 | Ht 76.0 in | Wt 282.7 lb

## 2018-05-05 DIAGNOSIS — N529 Male erectile dysfunction, unspecified: Secondary | ICD-10-CM

## 2018-05-05 DIAGNOSIS — E349 Endocrine disorder, unspecified: Secondary | ICD-10-CM

## 2018-05-05 DIAGNOSIS — N401 Enlarged prostate with lower urinary tract symptoms: Secondary | ICD-10-CM

## 2018-05-05 DIAGNOSIS — N138 Other obstructive and reflux uropathy: Secondary | ICD-10-CM

## 2018-05-05 MED ORDER — TESTOSTERONE CYPIONATE 200 MG/ML IM SOLN
INTRAMUSCULAR | 0 refills | Status: DC
Start: 2018-05-05 — End: 2018-09-20

## 2018-07-29 ENCOUNTER — Other Ambulatory Visit: Payer: Self-pay | Admitting: Family Medicine

## 2018-07-29 DIAGNOSIS — E349 Endocrine disorder, unspecified: Secondary | ICD-10-CM

## 2018-08-04 ENCOUNTER — Other Ambulatory Visit: Payer: BLUE CROSS/BLUE SHIELD

## 2018-08-04 DIAGNOSIS — E349 Endocrine disorder, unspecified: Secondary | ICD-10-CM | POA: Diagnosis not present

## 2018-08-05 ENCOUNTER — Telehealth: Payer: Self-pay

## 2018-08-05 LAB — TESTOSTERONE: Testosterone: 637 ng/dL (ref 264–916)

## 2018-08-05 NOTE — Telephone Encounter (Signed)
-----   Message from Harle BattiestShannon A McGowan, PA-C sent at 08/05/2018  7:23 AM EDT ----- Please let Mr. Ernest Mallicksley know that his testosterone level was good.

## 2018-08-05 NOTE — Telephone Encounter (Signed)
Patient notified

## 2018-09-20 ENCOUNTER — Other Ambulatory Visit: Payer: Self-pay | Admitting: Urology

## 2018-10-26 ENCOUNTER — Other Ambulatory Visit: Payer: Self-pay

## 2018-10-26 DIAGNOSIS — E349 Endocrine disorder, unspecified: Secondary | ICD-10-CM

## 2018-10-26 DIAGNOSIS — E291 Testicular hypofunction: Secondary | ICD-10-CM

## 2018-10-26 DIAGNOSIS — N529 Male erectile dysfunction, unspecified: Secondary | ICD-10-CM

## 2018-10-27 ENCOUNTER — Other Ambulatory Visit: Payer: BLUE CROSS/BLUE SHIELD

## 2018-10-27 DIAGNOSIS — N529 Male erectile dysfunction, unspecified: Secondary | ICD-10-CM

## 2018-10-27 DIAGNOSIS — E349 Endocrine disorder, unspecified: Secondary | ICD-10-CM | POA: Diagnosis not present

## 2018-10-27 DIAGNOSIS — E291 Testicular hypofunction: Secondary | ICD-10-CM | POA: Diagnosis not present

## 2018-10-28 LAB — HEMOGLOBIN AND HEMATOCRIT, BLOOD
Hematocrit: 47.8 % (ref 37.5–51.0)
Hemoglobin: 16.6 g/dL (ref 13.0–17.7)

## 2018-10-28 LAB — TESTOSTERONE: Testosterone: 625 ng/dL (ref 264–916)

## 2018-11-03 ENCOUNTER — Encounter: Payer: Self-pay | Admitting: Urology

## 2018-11-03 ENCOUNTER — Ambulatory Visit (INDEPENDENT_AMBULATORY_CARE_PROVIDER_SITE_OTHER): Payer: BLUE CROSS/BLUE SHIELD | Admitting: Urology

## 2018-11-03 VITALS — BP 152/88 | HR 66 | Ht 76.0 in | Wt 294.6 lb

## 2018-11-03 DIAGNOSIS — N529 Male erectile dysfunction, unspecified: Secondary | ICD-10-CM

## 2018-11-03 DIAGNOSIS — N401 Enlarged prostate with lower urinary tract symptoms: Secondary | ICD-10-CM | POA: Diagnosis not present

## 2018-11-03 DIAGNOSIS — E349 Endocrine disorder, unspecified: Secondary | ICD-10-CM

## 2018-11-03 DIAGNOSIS — N138 Other obstructive and reflux uropathy: Secondary | ICD-10-CM

## 2018-11-03 MED ORDER — TESTOSTERONE CYPIONATE 200 MG/ML IM SOLN: INTRAMUSCULAR | 0 refills | Status: DC

## 2018-11-03 NOTE — Progress Notes (Signed)
11/03/2018 6:24 PM   Robert Trevino 12/22/1970 [id number]  Referring provider: Geoffery Lyonsurner, Eric M, PA [address] [place], [place] [id number]   Chief Complaint  Patient presents with  . Follow-up  . testosterone deficiency   HPI: Patient is a 47 year old Caucasian male with testosterone deficiency, erectile dysfunction and BPH with LU TS who presents today for a 6 months follow up.   Testosterone deficiency He is still having spontaneous erections at night.  He has sleep apnea and is sleeping with a CPAP machine.  His most recent testosterone is 625 ng/dL 7 days ago. His last testosterone level was 311 ng/dL in 19/147812/2018.  He is currently managing his testosterone deficiency with testosterone cypionate 200 mg/mL, 0.75 cc every two weeks.  His testosterone level is 625 ng/dL on 29/56/213012/09/2018.  From 10/27/18,  HCT is 16.6% and Hbg is 47.8 g/dL.    Erectile dysfunction His SHIM score is 19, which is mild ED.   His previous SHIM score was 20.  He has been having difficulty with erections for last few years.  His major complaint is getting an erection.  His libido is preserved.   His risk factors for ED are age, BPH, testosterone deficiency and sleep apnea (he sleeps with a CPAP machine).    He denies any painful erections or curvatures with his erections.     SHIM    Row Name 11/03/18 1539         SHIM: Over the last 6 months:   How do you rate your confidence that you could get and keep an erection?  Moderate     When you had erections with sexual stimulation, how often were your erections hard enough for penetration (entering your partner)?  Most Times (much more than half the time)     During sexual intercourse, how often were you able to maintain your erection after you had penetrated (entered) your partner?  Most Times (much more than half the time)     During sexual intercourse, how difficult was it to maintain your erection to completion of intercourse?  Slightly Difficult     When you  attempted sexual intercourse, how often was it satisfactory for you?  Most Times (much more than half the time)       SHIM Total Score   SHIM  19       Score: 1-7 Severe ED 8-11 Moderate ED 12-16 Mild-Moderate ED 17-21 Mild ED 22-25 No ED  BPH WITH LUTS His IPSS score today is 3 which is mild lower urinary tract symptomatology. He is pleased with his quality life due to his urinary symptoms.  His previous I PSS score was 3.      His has no major complaints today.  He denies any dysuria, hematuria or suprapubic pain.      He also denies any recent fevers, chills, nausea or vomiting.  He does not have a family history of PCa   IPSS    Row Name 11/03/18 1500         International Prostate Symptom Score   How often have you had the sensation of not emptying your bladder?  Not at All     How often have you had to urinate less than every two hours?  Less than 1 in 5 times     How often have you found you stopped and started again several times when you urinated?  Not at All     How often  have you found it difficult to postpone urination?  Not at All     How often have you had a weak urinary stream?  Not at All     How often have you had to strain to start urination?  Not at All     How many times did you typically get up at night to urinate?  2 Times     Total IPSS Score  3       Quality of Life due to urinary symptoms   If you were to spend the rest of your life with your urinary condition just the way it is now how would you feel about that?  Delighted        Score:  1-7 Mild 8-19 Moderate 20-35 Severe   PMH: Past Medical History:  Diagnosis Date  . Low testosterone   . Sleep apnea     Surgical History: Past Surgical History:  Procedure Laterality Date  . NO PAST SURGERIES     verified with patient 10/22/17    Home Medications:  Allergies as of 11/03/2018   No Known Allergies     Medication List       Accurate as of November 03, 2018 11:59 PM. Always  use your most recent med list.        fluticasone 50 MCG/ACT nasal spray Commonly known as:  FLONASE Place 2 sprays into both nostrils as needed.   mupirocin ointment 2 % Commonly known as:  BACTROBAN Place 1 application into the nose 2 (two) times daily.   rosuvastatin 20 MG tablet Commonly known as:  CRESTOR   testosterone cypionate 200 MG/ML injection Commonly known as:  DEPOTESTOSTERONE CYPIONATE 0.75 cc every two weeks       Allergies: No Known Allergies  Family History: Family History  Problem Relation Age of Onset  . Stroke Father   . Diabetes Mother   . Healthy Daughter   . Kidney disease Neg Hx   . Prostate cancer Neg Hx   . Kidney cancer Neg Hx   . Bladder Cancer Neg Hx     Social History:  reports that he has never smoked. He has never used smokeless tobacco. He reports current alcohol use. He reports that he does not use drugs.  ROS: UROLOGY Frequent Urination?: No Hard to postpone urination?: No Burning/pain with urination?: No Get up at night to urinate?: No Leakage of urine?: No Urine stream starts and stops?: No Trouble starting stream?: No Do you have to strain to urinate?: No Blood in urine?: No Urinary tract infection?: No Sexually transmitted disease?: No Injury to kidneys or bladder?: No Painful intercourse?: No Weak stream?: No Erection problems?: No Penile pain?: No  Gastrointestinal Nausea?: No Vomiting?: No Indigestion/heartburn?: No Diarrhea?: No Constipation?: No  Constitutional Fever: No Night sweats?: No Weight loss?: No Fatigue?: No  Skin Skin rash/lesions?: No Itching?: No  Eyes Blurred vision?: No Double vision?: No  Ears/Nose/Throat Sore throat?: No  Hematologic/Lymphatic Swollen glands?: No Easy bruising?: No  Cardiovascular Leg swelling?: No Chest pain?: No  Respiratory Cough?: No Shortness of breath?: No  Endocrine Excessive thirst?: No  Musculoskeletal Back pain?: No Joint pain?:  No  Neurological Headaches?: No Dizziness?: No  Psychologic Depression?: No Anxiety?: No  Physical Exam: BP (!) 152/88 (BP Location: Left Arm, Patient Position: Sitting, Cuff Size: Normal)   Pulse 66   Ht 6\' 4"  (1.93 m)   Wt 294 lb 9.6 oz (133.6 kg)   BMI 35.86 kg/m  Constitutional: Well nourished. Alert and oriented, No acute distress. HEENT: [place] AT, moist mucus membranes. Trachea midline, no masses. Cardiovascular: No clubbing, cyanosis, or edema. Respiratory: Normal respiratory effort, no increased work of breathing. GU: No CVA tenderness.  No bladder fullness or masses.  Patient with circumcised phallus.  Urethral meatus is patent.  No penile discharge. No penile lesions or rashes. Scrotum without lesions, cysts, rashes and/or edema.  Testicles are located scrotally bilaterally. No masses are appreciated in the testicles. Left and right epididymis are normal. Rectal: Patient with  normal sphincter tone. Anus and perineum without scarring or rashes. No rectal masses are appreciated. Prostate is approximately 40 grams, apex and mid-gland, nonodules are appreciated. Seminal vesicles are normal.   Skin: No rashes, bruises or suspicious lesions. Neurologic: Grossly intact, no focal deficits, moving all 4 extremities. Psychiatric: Normal mood and affect.  Laboratory Data: Lab Results  Component Value Date   HGB 16.6 10/27/2018   HCT 47.8 10/27/2018   PSA History  0.7 ng/mL on 05/01/2016  0.6 ng/mLon 12/16/2016  0.7 ng/mL in 10/2017  0.7 ng/mL in 03/2018  0.6 ng/mL in 10/2018 Lab Results  Component Value Date   TESTOSTERONE 625 10/27/2018   I have reviewed the labs.  Assessment & Plan:    1. Testosterone deficiency   -most recent testosterone level was 625 ng/dL on 16/10/96 (therapeutic levels 450-600)  -continue testosterone cypionate 200 mg/mL , 0.75 cc IM every two weeks   -RTC in 6 months for HCT/HBG and testosterone (one week after an injection)    2. BPH with  LUTS  - IPSS score is 3, improved  - Continue conservative management, avoiding bladder irritants and timed voiding's  - RTC in 6 months for IPSS, PSA and exam, as testosterone therapy can cause prostate enlargement and worsen LUTS   3. Erectile dysfunction:     -SHIM score is 19, it is slightly worse  -RTC in 6 months for SHIM score and exam, as testosterone therapy can affect erections  Return in about 6 months (around 05/05/2019) for PSA, testosterone (one week after injection) H & H, IPSS, SHIM and exam.  These notes generated with voice recognition software. I apologize for typographical errors.  Michiel Cowboy, PA-C  [facility] [address] [place], [place] [address] [phone]  I, Donne Hazel, am acting as a Neurosurgeon for [employer],  I have reviewed the above documentation for accuracy and completeness, and I agree with the above.    Michiel Cowboy, PA-C

## 2018-11-04 ENCOUNTER — Telehealth: Payer: Self-pay

## 2018-11-04 LAB — PSA: Prostate Specific Ag, Serum: 0.6 ng/mL (ref 0.0–4.0)

## 2018-11-04 NOTE — Telephone Encounter (Signed)
Called pt informed him of the information below. Pt gave verbal understanding.  

## 2018-11-04 NOTE — Telephone Encounter (Signed)
-----   Message from Harle BattiestShannon A McGowan, PA-C sent at 11/04/2018  7:54 AM EST ----- Please let Robert Trevino know that his PSA was stable at 0.6.

## 2019-01-31 NOTE — Progress Notes (Signed)
02/02/2019 9:39 AM   Robert Trevino 1971/03/16 [id number]  Referring provider: Geoffery Lyons, PA [address] [place], [place] [address]   Chief Complaint  Patient presents with   Follow-up   HPI: Robert Trevino is a 48 y.o. male Caucasian with testosterone deficiency, erectile dysfunction and BPH with LU TS who presents today for a worsening of his ED.   Testosterone deficiency He is still having spontaneous erections at night.  He has sleep apnea and is sleeping with a CPAP machine.  His most recent testosterone is 625 ng/dL 7 days ago. His last testosterone level was 311 ng/dL in 03/3975.  He is currently managing his testosterone deficiency with testosterone cypionate 200 mg/mL, 0.75 cc every two weeks.  His testosterone level is 625 ng/dL on 73/41/9379.  From 10/27/18,  HCT is 16.6% and Hbg is 47.8 g/dL.  Erectile dysfunction His SHIM score is 11, which is moderate ED.  His previous SHIM score was 19.  He has been having difficulty with erections for the last few years.   His major complaint is getting an erection.  His libido is preserved.  His risk factors for ED are age, BPH, testosterone deficiency, and sleep apnea (for which he sleeps with a CPAP machine).  He denies any painful erections or curvatures with his erections.  He is still sometimes having spontaneous erections.  Today (02/02/2019) he reports that he feels he's been gradually having his ED symptoms worsen; it is 'nothing like it used to be.'  Patient admits he has not had his cholesterol checked recently, and the last time he saw his PCP was last year.  Patient reports that he exercises when he can.  SHIM    Row Name 02/02/19 0846         SHIM: Over the last 6 months:   How do you rate your confidence that you could get and keep an erection?  Very Low     When you had erections with sexual stimulation, how often were your erections hard enough for penetration (entering your partner)?  A Few Times (much less than  half the time)     During sexual intercourse, how often were you able to maintain your erection after you had penetrated (entered) your partner?  A Few Times (much less than half the time)     During sexual intercourse, how difficult was it to maintain your erection to completion of intercourse?  Difficult     When you attempted sexual intercourse, how often was it satisfactory for you?  Sometimes (about half the time)       SHIM Total Score   SHIM  11       Score: 1-7 Severe ED 8-11 Moderate ED 12-16 Mild-Moderate ED 17-21 Mild ED 22-25 No ED  BPH WITH LUTS  (prostate and/or bladder) IPSS score: 5/1  Previous score: 3/1  Denies any dysuria, hematuria or suprapubic pain.  Denies any recent fevers, chills, nausea or vomiting.  He does not have a family history of PCa.  IPSS    Row Name 02/02/19 0800         International Prostate Symptom Score   How often have you had the sensation of not emptying your bladder?  Not at All     How often have you had to urinate less than every two hours?  Less than 1 in 5 times     How often have you found you stopped and started again several times  when you urinated?  Less than 1 in 5 times     How often have you found it difficult to postpone urination?  Not at All     How often have you had a weak urinary stream?  Less than 1 in 5 times     How often have you had to strain to start urination?  Not at All     How many times did you typically get up at night to urinate?  2 Times     Total IPSS Score  5       Quality of Life due to urinary symptoms   If you were to spend the rest of your life with your urinary condition just the way it is now how would you feel about that?  Pleased       Score:  1-7 Mild 8-19 Moderate 20-35 Severe  PMH: Past Medical History:  Diagnosis Date   Low testosterone    Sleep apnea     Surgical History: Past Surgical History:  Procedure Laterality Date   NO PAST SURGERIES     verified with patient  10/22/17    Home Medications:  Allergies as of 02/02/2019   No Known Allergies     Medication List       Accurate as of February 02, 2019  9:39 AM. Always use your most recent med list.        fluticasone 50 MCG/ACT nasal spray Commonly known as:  FLONASE Place 2 sprays into both nostrils as needed.   mupirocin ointment 2 % Commonly known as:  BACTROBAN Place 1 application into the nose 2 (two) times daily.   rosuvastatin 20 MG tablet Commonly known as:  CRESTOR   sildenafil 20 MG tablet Commonly known as:  REVATIO Take 3 to 5 tablets two hours before intercouse on an empty stomach.  Do not take with nitrates.   tadalafil 5 MG tablet Commonly known as:  CIALIS Take 1 tablet (5 mg total) by mouth daily as needed for erectile dysfunction.   testosterone cypionate 200 MG/ML injection Commonly known as:  DEPOTESTOSTERONE CYPIONATE 0.75 cc every two weeks       Allergies: No Known Allergies  Family History: Family History  Problem Relation Age of Onset   Stroke Father    Diabetes Mother    Healthy Daughter    Kidney disease Neg Hx    Prostate cancer Neg Hx    Kidney cancer Neg Hx    Bladder Cancer Neg Hx     Social History:  reports that he has never smoked. He has never used smokeless tobacco. He reports current alcohol use. He reports that he does not use drugs.  ROS: UROLOGY Frequent Urination?: No Hard to postpone urination?: No Burning/pain with urination?: No Get up at night to urinate?: No Leakage of urine?: No Urine stream starts and stops?: No Trouble starting stream?: No Do you have to strain to urinate?: No Blood in urine?: No Urinary tract infection?: No Sexually transmitted disease?: No Injury to kidneys or bladder?: No Painful intercourse?: No Weak stream?: No Erection problems?: No Penile pain?: No  Gastrointestinal Nausea?: No Vomiting?: No Indigestion/heartburn?: No Diarrhea?: No Constipation?: No  Constitutional Fever:  No Night sweats?: No Weight loss?: No Fatigue?: No  Skin Skin rash/lesions?: No Itching?: No  Eyes Blurred vision?: No Double vision?: No  Ears/Nose/Throat Sore throat?: No Sinus problems?: No  Hematologic/Lymphatic Swollen glands?: No Easy bruising?: No  Cardiovascular Leg swelling?: No Chest pain?:  No  Respiratory Cough?: No Shortness of breath?: No  Endocrine Excessive thirst?: No  Musculoskeletal Back pain?: No Joint pain?: No  Neurological Headaches?: No Dizziness?: No  Psychologic Depression?: No Anxiety?: No  Physical Exam: BP (!) 153/90    Pulse 74    Ht 6\' 4"  (1.93 m)    Wt 285 lb (129.3 kg)    BMI 34.69 kg/m   Constitutional:  Well nourished. Alert and oriented, No acute distress. HEENT: [place] AT, moist mucus membranes.  Trachea midline. Cardiovascular: No clubbing, cyanosis, or edema. Respiratory: Normal respiratory effort, no increased work of breathing. GI: Abdomen is soft, non tender, non distended, no abdominal masses. Liver and spleen not palpable.  No hernias appreciated. GU: No CVA tenderness.  No bladder fullness or masses.  Patient with circumcised phallus. Urethral meatus is patent.  No penile discharge. No penile lesions or rashes. Scrotum without lesions, cysts, rashes and/or edema.  Testicles are located scrotally bilaterally. No masses are appreciated in the testicles. Left and right epididymis are normal. Rectal: Patient with normal sphincter tone.  Anus and perineum without scarring or rashes.  No rectal masses are appreciated. Prostate is approximately 50 grams, only the apex and midportion could be palpated, no nodules are appreciated.  Seminal vesicles could be palpated. Skin: No rashes, bruises or suspicious lesions. Lymph: No inguinal adenopathy. Neurologic: Grossly intact, no focal deficits, moving all 4 extremities. Psychiatric: Normal mood and affect.  Laboratory Data: Lab Results  Component Value Date   HGB 16.6 10/27/2018    HCT 47.8 10/27/2018   PSA History  0.7 ng/mL on 05/01/2016  0.6 ng/mLon 12/16/2016  0.7 ng/mL in 10/2017  0.7 ng/mL in 03/2018  0.6 ng/mL in 10/2018  Lab Results  Component Value Date   TESTOSTERONE 625 10/27/2018   I have reviewed the labs.  Assessment & Plan:    1. Testosterone deficiency  - Most recent testosterone level was 625 ng/dL on 54/36/06 (therapeutic levels 450-600) - Continue testosterone cypionate 200 mg/mL , 0.75 cc IM every two weeks  - labs are drawn today (HCT, Hbg, testosterone and prolactin) - RTC in 6 months for HCT/HBG and testosterone (one week after an injection)  2. BPH with LUTS - IPSS score is 5/1, it has slightly worse - Continue conservative management, avoiding bladder irritants and timed voiding's - RTC in 6 months for IPSS, PSA and exam, as testosterone therapy can cause prostate enlargement and worsen LUTS  3. Erectile dysfunction:    - SHIM score is 11, it is improved - However, patient was advised that if his testosterone levels today are good, he will definitely need to follow up with his PCP for possible cardiac issues - A recent study published in Sex Med 2018 Apr 13 revealed moderate to vigorous aerobic exercise for 40 minutes 4 times per week can decrease erectile problems caused by physical inactivity, obesity, hypertension, metabolic syndrome and/or cardiovascular diseases - He would like to try a PDE5 inhibitor today; patient was provided with perscriptions and instructed to fill whichever is best - RTC in 6 months for SHIM score and exam, as testosterone therapy can affect erections  Return in about 6 months (around 08/05/2019) for PSA, testosterone (one week after injection) H & H, IPSS, SHIM and exam.  Michiel Cowboy, [facility]  [facility] [address] [place], [place] [address] [phone]  I, Duanne Moron, am acting as a Neurosurgeon for [employer], PA-C.   I have reviewed the above  documentation for accuracy and completeness, and I agree with the above.    Michiel Cowboy, PA-C

## 2019-02-02 ENCOUNTER — Other Ambulatory Visit: Payer: Self-pay

## 2019-02-02 ENCOUNTER — Ambulatory Visit: Payer: BLUE CROSS/BLUE SHIELD | Admitting: Urology

## 2019-02-02 ENCOUNTER — Encounter: Payer: Self-pay | Admitting: Urology

## 2019-02-02 ENCOUNTER — Other Ambulatory Visit: Payer: BLUE CROSS/BLUE SHIELD

## 2019-02-02 VITALS — BP 153/90 | HR 74 | Ht 76.0 in | Wt 285.0 lb

## 2019-02-02 DIAGNOSIS — E349 Endocrine disorder, unspecified: Secondary | ICD-10-CM

## 2019-02-02 DIAGNOSIS — N401 Enlarged prostate with lower urinary tract symptoms: Secondary | ICD-10-CM | POA: Diagnosis not present

## 2019-02-02 DIAGNOSIS — N138 Other obstructive and reflux uropathy: Secondary | ICD-10-CM

## 2019-02-02 DIAGNOSIS — N529 Male erectile dysfunction, unspecified: Secondary | ICD-10-CM | POA: Diagnosis not present

## 2019-02-02 MED ORDER — TADALAFIL 5 MG PO TABS: 5.0000 mg | ORAL_TABLET | Freq: Every day | ORAL | 3 refills | Status: DC | PRN

## 2019-02-02 MED ORDER — SILDENAFIL CITRATE 20 MG PO TABS: ORAL_TABLET | ORAL | 3 refills | Status: DC

## 2019-02-02 MED ORDER — TESTOSTERONE CYPIONATE 200 MG/ML IM SOLN: INTRAMUSCULAR | 3 refills | Status: DC

## 2019-02-03 ENCOUNTER — Telehealth: Payer: Self-pay

## 2019-02-03 LAB — TESTOSTERONE: TESTOSTERONE: 670 ng/dL (ref 264–916)

## 2019-02-03 LAB — PROLACTIN: Prolactin: 14.8 ng/mL (ref 4.0–15.2)

## 2019-02-03 LAB — PSA: PROSTATE SPECIFIC AG, SERUM: 0.7 ng/mL (ref 0.0–4.0)

## 2019-02-03 LAB — HEMOGLOBIN: Hemoglobin: 17.1 g/dL (ref 13.0–17.7)

## 2019-02-03 LAB — HEMATOCRIT: Hematocrit: 50.2 % (ref 37.5–51.0)

## 2019-02-03 NOTE — Telephone Encounter (Signed)
Called patient and advised of results and message from [facility] to follow up with PCP for heart health. Patient verbalized understanding.

## 2019-02-03 NOTE — Telephone Encounter (Signed)
-----   Message from Harle Battiest, PA-C sent at 02/03/2019  7:51 AM EDT ----- Please let Mr. Brehm know that his blood work came back normal.  His testosterone level is at 670 which is a little bit higher than it was in December.  He needs to have an appointment with his PCP to check on his heart health.

## 2019-04-26 ENCOUNTER — Other Ambulatory Visit: Payer: Self-pay | Admitting: Family Medicine

## 2019-04-26 DIAGNOSIS — E349 Endocrine disorder, unspecified: Secondary | ICD-10-CM

## 2019-04-27 ENCOUNTER — Other Ambulatory Visit: Payer: BC Managed Care – PPO

## 2019-04-27 ENCOUNTER — Other Ambulatory Visit: Payer: Self-pay

## 2019-04-27 DIAGNOSIS — E349 Endocrine disorder, unspecified: Secondary | ICD-10-CM

## 2019-04-28 ENCOUNTER — Telehealth: Payer: Self-pay

## 2019-04-28 LAB — TESTOSTERONE: Testosterone: 700 ng/dL (ref 264–916)

## 2019-04-28 NOTE — Telephone Encounter (Signed)
-----   Message from Nori Riis, PA-C sent at 04/28/2019  2:24 PM EDT ----- Please let Mr. Detter know that his testosterone level was normal.

## 2019-04-28 NOTE — Telephone Encounter (Signed)
Patient notified

## 2019-05-04 ENCOUNTER — Ambulatory Visit: Payer: BLUE CROSS/BLUE SHIELD | Admitting: Urology

## 2019-05-12 ENCOUNTER — Ambulatory Visit: Payer: Self-pay | Admitting: Nurse Practitioner

## 2019-05-17 ENCOUNTER — Other Ambulatory Visit: Payer: Self-pay

## 2019-05-17 ENCOUNTER — Encounter: Payer: Self-pay | Admitting: Nurse Practitioner

## 2019-05-17 ENCOUNTER — Ambulatory Visit: Payer: BC Managed Care – PPO | Admitting: Nurse Practitioner

## 2019-05-17 VITALS — BP 138/96 | HR 70 | Temp 97.5°F | Resp 16 | Ht 76.0 in | Wt 291.2 lb

## 2019-05-17 DIAGNOSIS — N529 Male erectile dysfunction, unspecified: Secondary | ICD-10-CM

## 2019-05-17 DIAGNOSIS — E291 Testicular hypofunction: Secondary | ICD-10-CM

## 2019-05-17 DIAGNOSIS — E782 Mixed hyperlipidemia: Secondary | ICD-10-CM

## 2019-05-17 NOTE — Progress Notes (Signed)
Pt blood pressure elevated, informed provider. 

## 2019-05-17 NOTE — Progress Notes (Signed)
[facility] [address] [place], [place] [id number]  Internal MEDICINE  Office Visit Note  Patient Name: Robert Trevino  [id number]  [id number]  Date of Service: 05/17/2019  Chief Complaint  Patient presents with  . Medical Management of Chronic Issues    general follow up    The patient is here for routine follow up visit. He states that he has been having issues of claustrophobia and panic attacks. States that his heart doctor put him on something to help with this, however, he cannot remember the name. He does need to have routine blood work done. He sees urologist twice yearly. They check testosterone, PSA, and prolactin levels. All have been within normal limits. Needs to have cholesterol and other routine labs checked.  He has no other concerns or complaints to discuss today.       Current Medication: Outpatient Encounter Medications as of 05/17/2019  Medication Sig  . fluticasone (FLONASE) 50 MCG/ACT nasal spray Place 2 sprays into both nostrils as needed.   . mupirocin ointment (BACTROBAN) 2 % Place 1 application into the nose 2 (two) times daily.  . rosuvastatin (CRESTOR) 20 MG tablet   . sildenafil (REVATIO) 20 MG tablet Take 3 to 5 tablets two hours before intercouse on an empty stomach.  Do not take with nitrates.  . tadalafil (CIALIS) 5 MG tablet Take 1 tablet (5 mg total) by mouth daily as needed for erectile dysfunction.  Marland Kitchen. testosterone cypionate (DEPOTESTOSTERONE CYPIONATE) 200 MG/ML injection 0.75 cc every two weeks   No facility-administered encounter medications on file as of 05/17/2019.     Surgical History: Past Surgical History:  Procedure Laterality Date  . NO PAST SURGERIES     verified with patient 10/22/17    Medical History: Past Medical History:  Diagnosis Date  . Low testosterone   . Sleep apnea     Family History: Family History  Problem Relation Age of Onset  . Stroke Father   . Diabetes Mother   . Healthy Daughter   .  Kidney disease Neg Hx   . Prostate cancer Neg Hx   . Kidney cancer Neg Hx   . Bladder Cancer Neg Hx     Social History   Socioeconomic History  . Marital status: Married    Spouse name: Not on file  . Number of children: Not on file  . Years of education: Not on file  . Highest education level: Not on file  Occupational History  . Not on file  Social Needs  . Financial resource strain: Not on file  . Food insecurity    Worry: Not on file    Inability: Not on file  . Transportation needs    Medical: Not on file    Non-medical: Not on file  Tobacco Use  . Smoking status: Never Smoker  . Smokeless tobacco: Never Used  Substance and Sexual Activity  . Alcohol use: Yes    Comment: 1-2 Beers Weekly  . Drug use: No  . Sexual activity: Not on file  Lifestyle  . Physical activity    Days per week: Not on file    Minutes per session: Not on file  . Stress: Not on file  Relationships  . Social Musicianconnections    Talks on phone: Not on file    Gets together: Not on file    Attends religious service: Not on file    Active member of club or organization: Not on file  Attends meetings of clubs or organizations: Not on file    Relationship status: Not on file  . Intimate partner violence    Fear of current or ex partner: Not on file    Emotionally abused: Not on file    Physically abused: Not on file    Forced sexual activity: Not on file  Other Topics Concern  . Not on file  Social History Narrative  . Not on file      Review of Systems  Constitutional: Negative for activity change, chills, fatigue and unexpected weight change.  HENT: Negative for congestion, postnasal drip, rhinorrhea, sneezing and sore throat.   Respiratory: Negative for cough, chest tightness and shortness of breath.   Cardiovascular: Negative for chest pain and palpitations.  Gastrointestinal: Negative for abdominal pain, constipation, diarrhea, nausea and vomiting.  Endocrine: Negative for cold  intolerance, heat intolerance, polydipsia and polyuria.       Has prolactin and testosterone levels checked every six months per urology. Have all bee normal.   Musculoskeletal: Negative for arthralgias, back pain, joint swelling and neck pain.  Skin: Negative for rash.  Neurological: Negative for dizziness, tremors, numbness and headaches.  Hematological: Negative for adenopathy. Does not bruise/bleed easily.  Psychiatric/Behavioral: Negative for behavioral problems (Depression), sleep disturbance and suicidal ideas. The patient is nervous/anxious.    Today's Vitals   05/17/19 1001  BP: (!) 138/96  Pulse: 70  Resp: 16  Temp: (!) 97.5 F (36.4 C)  SpO2: 96%  Weight: 291 lb 3.2 oz (132.1 kg)  Height: 6\' 4"  (1.93 m)   Body mass index is 35.45 kg/m.  Physical Exam Vitals signs and nursing note reviewed.  Constitutional:      General: He is not in acute distress.    Appearance: Normal appearance. He is well-developed. He is not diaphoretic.  HENT:     Head: Normocephalic and atraumatic.     Mouth/Throat:     Pharynx: No oropharyngeal exudate.  Eyes:     Extraocular Movements: Extraocular movements intact.     Conjunctiva/sclera: Conjunctivae normal.     Pupils: Pupils are equal, round, and reactive to light.  Neck:     Musculoskeletal: Normal range of motion and neck supple.     Thyroid: No thyromegaly.     Vascular: No JVD.     Trachea: No tracheal deviation.  Cardiovascular:     Rate and Rhythm: Normal rate and regular rhythm.     Heart sounds: Normal heart sounds. No murmur. No friction rub. No gallop.   Pulmonary:     Effort: Pulmonary effort is normal. No respiratory distress.     Breath sounds: Normal breath sounds. No wheezing or rales.  Chest:     Chest wall: No tenderness.  Abdominal:     General: Bowel sounds are normal.     Palpations: Abdomen is soft.     Tenderness: There is no abdominal tenderness.  Musculoskeletal: Normal range of motion.   Lymphadenopathy:     Cervical: No cervical adenopathy.  Skin:    General: Skin is warm and dry.  Neurological:     Mental Status: He is alert and oriented to person, place, and time.     Cranial Nerves: No cranial nerve deficit.  Psychiatric:        Behavior: Behavior normal.        Thought Content: Thought content normal.        Judgment: Judgment normal.    Assessment/Plan: 1. Mixed hyperlipidemia Check fasting lipid  panel and adjust rosuvastatin as indicated.   2. Hypogonadism in male Reviewed labs from urology which were within normal limits. Continue regular visits with urology as scheduled.   3. Erectile dysfunction of organic origin Continue regular visits with urology as scheduled.    General Counseling: Robert Trevino verbalizes understanding of the findings of todays visit and agrees with plan of treatment. I have discussed any further diagnostic evaluation that may be needed or ordered today. We also reviewed his medications today. he has been encouraged to call the office with any questions or concerns that should arise related to todays visit.  This patient was seen by Vincent GrosHeather Erman Thum FNP Collaboration with Dr Lyndon CodeFozia M Khan as a part of collaborative care agreement  Time spent: 15 Minutes      Dr Lyndon CodeFozia M Khan Internal medicine

## 2019-05-30 ENCOUNTER — Other Ambulatory Visit: Payer: Self-pay | Admitting: Nurse Practitioner

## 2019-05-30 DIAGNOSIS — Z0001 Encounter for general adult medical examination with abnormal findings: Secondary | ICD-10-CM | POA: Diagnosis not present

## 2019-05-30 DIAGNOSIS — E782 Mixed hyperlipidemia: Secondary | ICD-10-CM | POA: Diagnosis not present

## 2019-05-30 DIAGNOSIS — R7301 Impaired fasting glucose: Secondary | ICD-10-CM | POA: Diagnosis not present

## 2019-05-31 LAB — HGB A1C W/O EAG: Hgb A1c MFr Bld: 5.5 % (ref 4.8–5.6)

## 2019-05-31 LAB — TSH: TSH: 1.23 u[IU]/mL (ref 0.450–4.500)

## 2019-05-31 LAB — LIPID PANEL W/O CHOL/HDL RATIO
Cholesterol, Total: 137 mg/dL (ref 100–199)
HDL: 34 mg/dL — ABNORMAL LOW (ref >39)
LDL Calculated: 86 mg/dL (ref 0–99)
Triglycerides: 85 mg/dL (ref 0–149)
VLDL Cholesterol Cal: 17 mg/dL (ref 5–40)

## 2019-05-31 LAB — CBC
Hematocrit: 47.4 % (ref 37.5–51.0)
Hemoglobin: 16.6 g/dL (ref 13.0–17.7)
MCH: 31.4 pg (ref 26.6–33.0)
MCHC: 35 g/dL (ref 31.5–35.7)
MCV: 90 fL (ref 79–97)
Platelets: 302 10*3/uL (ref 150–450)
RBC: 5.29 x10E6/uL (ref 4.14–5.80)
RDW: 12.1 % (ref 11.6–15.4)
WBC: 7.8 10*3/uL (ref 3.4–10.8)

## 2019-05-31 LAB — T3: T3, Total: 147 ng/dL (ref 71–180)

## 2019-05-31 LAB — BASIC METABOLIC PANEL
BUN/Creatinine Ratio: 10 (ref 9–20)
BUN: 13 mg/dL (ref 6–24)
CO2: 22 mmol/L (ref 20–29)
Calcium: 8.8 mg/dL (ref 8.7–10.2)
Chloride: 103 mmol/L (ref 96–106)
Creatinine, Ser: 1.26 mg/dL (ref 0.76–1.27)
GFR calc Af Amer: 79 mL/min/{1.73_m2} (ref >59)
GFR calc non Af Amer: 68 mL/min/{1.73_m2} (ref >59)
Glucose: 102 mg/dL — ABNORMAL HIGH (ref 65–99)
Potassium: 4.4 mmol/L (ref 3.5–5.2)
Sodium: 137 mmol/L (ref 134–144)

## 2019-05-31 LAB — T4, FREE: Free T4: 1.23 ng/dL (ref 0.82–1.77)

## 2019-06-07 LAB — PSA: Prostate Specific Ag, Serum: 0.6 ng/mL (ref 0.0–4.0)

## 2019-06-07 LAB — SPECIMEN STATUS REPORT

## 2019-06-16 DIAGNOSIS — M5412 Radiculopathy, cervical region: Secondary | ICD-10-CM | POA: Diagnosis not present

## 2019-06-16 DIAGNOSIS — M5414 Radiculopathy, thoracic region: Secondary | ICD-10-CM | POA: Diagnosis not present

## 2019-06-16 DIAGNOSIS — M9901 Segmental and somatic dysfunction of cervical region: Secondary | ICD-10-CM | POA: Diagnosis not present

## 2019-06-16 DIAGNOSIS — M9902 Segmental and somatic dysfunction of thoracic region: Secondary | ICD-10-CM | POA: Diagnosis not present

## 2019-06-29 NOTE — Progress Notes (Signed)
Patient seeing urology for PSA and testosterone treatment.

## 2019-07-22 ENCOUNTER — Other Ambulatory Visit: Payer: Self-pay | Admitting: Family Medicine

## 2019-07-22 DIAGNOSIS — E349 Endocrine disorder, unspecified: Secondary | ICD-10-CM

## 2019-07-26 ENCOUNTER — Other Ambulatory Visit: Payer: BC Managed Care – PPO

## 2019-07-26 ENCOUNTER — Other Ambulatory Visit: Payer: Self-pay

## 2019-07-26 DIAGNOSIS — E349 Endocrine disorder, unspecified: Secondary | ICD-10-CM

## 2019-07-27 ENCOUNTER — Other Ambulatory Visit: Payer: BLUE CROSS/BLUE SHIELD

## 2019-07-27 LAB — TESTOSTERONE: Testosterone: 241 ng/dL — ABNORMAL LOW (ref 264–916)

## 2019-08-02 NOTE — Progress Notes (Signed)
02/02/2019 9:14 AM   Robert ReeBruce B Marina 02/07/1971 [id number]  Referring provider: Geoffery Lyonsurner, Eric M, PA [address] [place],  [place] [id number]   Chief Complaint  Patient presents with  . Hypogonadism   HPI: Robert Trevino is a 48 y.o. male with testosterone deficiency, erectile dysfunction and BPH with LU TS who presents today for a follow up.    Testosterone deficiency He is still having spontaneous erections at night.  He has sleep apnea and is sleeping with a CPAP machine.  His most recent testosterone is 625 ng/dL 7 days ago. His last testosterone level was 311 ng/dL in 13/244012/2018.  He is currently managing his testosterone deficiency with testosterone cypionate 200 mg/mL, 0.75 cc every two weeks.  His testosterone level is 241 ng/dL on 10/27/253609/06/2019.    He injected himself the next day.  His 16.6% and Hgb is 47.4 g/dL.  Erectile dysfunction His SHIM score is 18, which is mild ED.  His previous SHIM score was 11.  He has been having difficulty with erections for the last few years.   His major complaint is getting an erection.  His libido is preserved.  His risk factors for ED are age, BPH, testosterone deficiency, and sleep apnea (for which he sleeps with a CPAP machine).  He denies any painful erections or curvatures with his erections.  He is still sometimes having spontaneous erections.  He is finding the Cialis 5 mg daily helpful with erections.   SHIM    Row Name 08/03/19 0859         SHIM: Over the last 6 months:   How do you rate your confidence that you could get and keep an erection?  Moderate     When you had erections with sexual stimulation, how often were your erections hard enough for penetration (entering your partner)?  Most Times (much more than half the time)     During sexual intercourse, how often were you able to maintain your erection after you had penetrated (entered) your partner?  Sometimes (about half the time)     During sexual intercourse, how difficult was it to  maintain your erection to completion of intercourse?  Slightly Difficult     When you attempted sexual intercourse, how often was it satisfactory for you?  Most Times (much more than half the time)       SHIM Total Score   SHIM  18       Score: 1-7 Severe ED 8-11 Moderate ED 12-16 Mild-Moderate ED 17-21 Mild ED 22-25 No ED  BPH WITH LUTS  (prostate and/or bladder) IPSS score: 4/1  Previous score: 5/1 Denies any dysuria, hematuria or suprapubic pain.  Denies any recent fevers, chills, nausea or vomiting.  He does not have a family history of PCa. IPSS    Row Name 08/03/19 0800         International Prostate Symptom Score   How often have you had the sensation of not emptying your bladder?  Not at All     How often have you had to urinate less than every two hours?  Less than 1 in 5 times     How often have you found you stopped and started again several times when you urinated?  Less than 1 in 5 times     How often have you found it difficult to postpone urination?  Not at All     How often have you had a weak urinary stream?  Not at All     How often have you had to strain to start urination?  Less than 1 in 5 times     How many times did you typically get up at night to urinate?  1 Time     Total IPSS Score  4       Quality of Life due to urinary symptoms   If you were to spend the rest of your life with your urinary condition just the way it is now how would you feel about that?  Pleased       Score:  1-7 Mild 8-19 Moderate 20-35 Severe  PMH: Past Medical History:  Diagnosis Date  . Low testosterone   . Sleep apnea     Surgical History: Past Surgical History:  Procedure Laterality Date  . NO PAST SURGERIES     verified with patient 10/22/17    Home Medications:  Allergies as of 08/03/2019   No Known Allergies     Medication List       Accurate as of August 03, 2019  9:14 AM. If you have any questions, ask your nurse or doctor.        STOP taking  these medications   mupirocin ointment 2 % Commonly known as: BACTROBAN Stopped by: Zara Council, PA-C     TAKE these medications   fluticasone 50 MCG/ACT nasal spray Commonly known as: FLONASE Place 2 sprays into both nostrils as needed.   rosuvastatin 20 MG tablet Commonly known as: CRESTOR   sildenafil 20 MG tablet Commonly known as: REVATIO Take 3 to 5 tablets two hours before intercouse on an empty stomach.  Do not take with nitrates.   tadalafil 5 MG tablet Commonly known as: CIALIS Take 1 tablet (5 mg total) by mouth daily as needed for erectile dysfunction.   testosterone cypionate 200 MG/ML injection Commonly known as: DEPOTESTOSTERONE CYPIONATE 0.75 cc every two weeks       Allergies: No Known Allergies  Family History: Family History  Problem Relation Age of Onset  . Stroke Father   . Diabetes Mother   . Healthy Daughter   . Kidney disease Neg Hx   . Prostate cancer Neg Hx   . Kidney cancer Neg Hx   . Bladder Cancer Neg Hx     Social History:  reports that he has never smoked. He has never used smokeless tobacco. He reports current alcohol use. He reports that he does not use drugs.  ROS: UROLOGY Frequent Urination?: No Hard to postpone urination?: No Burning/pain with urination?: No Get up at night to urinate?: No Leakage of urine?: No Urine stream starts and stops?: No Trouble starting stream?: No Do you have to strain to urinate?: No Blood in urine?: No Urinary tract infection?: No Sexually transmitted disease?: No Injury to kidneys or bladder?: No Painful intercourse?: No Weak stream?: No Erection problems?: No Penile pain?: No  Gastrointestinal Nausea?: No Vomiting?: No Indigestion/heartburn?: No Diarrhea?: No Constipation?: No  Constitutional Fever: No Night sweats?: No Weight loss?: No Fatigue?: No  Skin Skin rash/lesions?: No Itching?: No  Eyes Blurred vision?: No Double vision?: No  Ears/Nose/Throat Sore  throat?: No Sinus problems?: No  Hematologic/Lymphatic Swollen glands?: No Easy bruising?: No  Cardiovascular Leg swelling?: No Chest pain?: No  Respiratory Cough?: No Shortness of breath?: No  Endocrine Excessive thirst?: No  Musculoskeletal Back pain?: No Joint pain?: No  Neurological Headaches?: No Dizziness?: No  Psychologic Depression?: No Anxiety?: No  Physical Exam:  BP 138/83 (BP Location: Left Arm, Patient Position: Sitting, Cuff Size: Normal)   Pulse 76   Ht 6\' 4"  (1.93 m)   Wt 291 lb 3.2 oz (132.1 kg)   BMI 35.45 kg/m   Constitutional:  Well nourished. Alert and oriented, No acute distress. HEENT: [place] AT, moist mucus membranes.  Trachea midline, no masses. Cardiovascular: No clubbing, cyanosis, or edema. Respiratory: Normal respiratory effort, no increased work of breathing. GI: Abdomen is soft, non tender, non distended, no abdominal masses. Liver and spleen not palpable.  No hernias appreciated.  Stool sample for occult testing is not indicated.   GU: No CVA tenderness.  No bladder fullness or masses.  Patient with circumcised phallus.  Urethral meatus is patent.  No penile discharge. No penile lesions or rashes. Scrotum without lesions, cysts, rashes and/or edema.  Testicles are located scrotally bilaterally. No masses are appreciated in the testicles. Left and right epididymis are normal. Rectal: Patient with  normal sphincter tone. Anus and perineum without scarring or rashes. No rectal masses are appreciated. Prostate is approximately 50 grams, could only palpate the apex and the midportion the gland, no nodules are appreciated. Seminal vesicles could not be palpated.   Skin: No rashes, bruises or suspicious lesions. Lymph: No inguinal adenopathy. Neurologic: Grossly intact, no focal deficits, moving all 4 extremities. Psychiatric: Normal mood and affect.  Laboratory Data: Lab Results  Component Value Date   WBC 7.8 05/30/2019   HGB 16.6 05/30/2019    HCT 47.4 05/30/2019   MCV 90 05/30/2019   PLT 302 05/30/2019   PSA History  0.7 ng/mL on 05/01/2016  0.6 ng/mLon 12/16/2016  0.7 ng/mL in 10/2017  0.7 ng/mL in 03/2018  0.6 ng/mL in 10/2018  0.6 ng/mL in 05/2019  Lab Results  Component Value Date   TESTOSTERONE 241 (L) 07/26/2019   I have reviewed the labs.  Assessment & Plan:    1. Testosterone deficiency  - Most recent testosterone level was 241 ng/dL on 83/43/7357 (therapeutic levels 450-600) - Continue testosterone cypionate 200 mg/mL , 0.75 cc IM every two weeks  - recheck testosterone level today to catch his levels at the peak  - RTC in 6 months for HCT/HBG and testosterone (one week after an injection)  2. BPH with LUTS - IPSS score is 4/1, it has slightly improved  - Continue conservative management, avoiding bladder irritants and timed voiding's - RTC in 6 months for IPSS, PSA and exam, as testosterone therapy can cause prostate enlargement and worsen LUTS  3. Erectile dysfunction:    SHIM score is 18, it is improved Continue the Cialis 5 mg daily - refills given  RTC in 6 months for SHIM score and exam, as testosterone therapy can affect erections  Return in about 3 months (around 11/02/2019) for  testosterone (one week after injection) [facility]  [facility] [address] [place], [place] [address] [phone]

## 2019-08-03 ENCOUNTER — Encounter: Payer: Self-pay | Admitting: Urology

## 2019-08-03 ENCOUNTER — Other Ambulatory Visit: Payer: Self-pay

## 2019-08-03 ENCOUNTER — Ambulatory Visit (INDEPENDENT_AMBULATORY_CARE_PROVIDER_SITE_OTHER): Payer: BC Managed Care – PPO | Admitting: Urology

## 2019-08-03 VITALS — BP 138/83 | HR 76 | Ht 76.0 in | Wt 291.2 lb

## 2019-08-03 DIAGNOSIS — N401 Enlarged prostate with lower urinary tract symptoms: Secondary | ICD-10-CM

## 2019-08-03 DIAGNOSIS — N529 Male erectile dysfunction, unspecified: Secondary | ICD-10-CM | POA: Diagnosis not present

## 2019-08-03 DIAGNOSIS — E349 Endocrine disorder, unspecified: Secondary | ICD-10-CM

## 2019-08-03 DIAGNOSIS — N138 Other obstructive and reflux uropathy: Secondary | ICD-10-CM

## 2019-08-04 ENCOUNTER — Telehealth: Payer: Self-pay

## 2019-08-04 LAB — TESTOSTERONE: Testosterone: 789 ng/dL (ref 264–916)

## 2019-08-04 NOTE — Telephone Encounter (Signed)
Called pt informed him of the information below. Pt gave verbal understanding.  

## 2019-08-04 NOTE — Telephone Encounter (Signed)
-----   Message from Nori Riis, PA-C sent at 08/04/2019  7:54 AM EDT ----- Please let Mr. Sanjuan know that his testosterone returned within normal limits, so we do not need to adjust his dose.  We will see him in December for his blood work.

## 2019-08-11 ENCOUNTER — Other Ambulatory Visit: Payer: Self-pay | Admitting: Urology

## 2019-09-16 DIAGNOSIS — E785 Hyperlipidemia, unspecified: Secondary | ICD-10-CM | POA: Diagnosis not present

## 2019-09-16 DIAGNOSIS — I2510 Atherosclerotic heart disease of native coronary artery without angina pectoris: Secondary | ICD-10-CM | POA: Diagnosis not present

## 2019-10-24 ENCOUNTER — Encounter: Payer: Self-pay | Admitting: Nurse Practitioner

## 2019-10-24 ENCOUNTER — Other Ambulatory Visit: Payer: Self-pay

## 2019-10-24 ENCOUNTER — Ambulatory Visit: Payer: BC Managed Care – PPO | Admitting: Nurse Practitioner

## 2019-10-24 VITALS — Ht 76.0 in | Wt 289.0 lb

## 2019-10-24 DIAGNOSIS — J0140 Acute pansinusitis, unspecified: Secondary | ICD-10-CM | POA: Diagnosis not present

## 2019-10-24 MED ORDER — AZITHROMYCIN 250 MG PO TABS: ORAL_TABLET | ORAL | 0 refills | Status: DC

## 2019-10-24 NOTE — Progress Notes (Signed)
[facility] [address] [place], [place] [address]  Internal MEDICINE  Telephone Visit  Patient Name: Robert ReeBruce B Sivertson  132440Mar 20, 1972  [id number]  Date of Service: 10/24/2019  I connected with the patient at 8:45am by webcam and verified the patients identity using two identifiers.   I discussed the limitations, risks, security and privacy concerns of performing an evaluation and management service by webcam and the availability of in person appointments. I also discussed with the patient that there may be a patient responsible charge related to the service.  The patient expressed understanding and agrees to proceed.    Chief Complaint  Patient presents with  . Telephone Assessment    had a 99.8 temp last night   . Telephone Screen  . Sinusitis    coughing up stuff  . Chills    woke up at 3 am with body sweats    The patient has been contacted via webcam for follow up visit due to concerns for spread of novel coronavirus. Started having symptoms on Sunday morning sweating and with chills. Had no ffever, initially. Was working all day on Sunday. Felt tired and achy. Had 99.7 temp prior to going out. Laid down for a while. This morning, he has no fever. Has sinus pressure above his eyes. Has been coughing up some phlegm this morning. This is familiar to him. Will usually get sinus infection this time of the year. He states that he does work with a few people who have tested positive for COVID 19, however, he does not work near them. He and coworkers are socially distant at work. He was not around anyone he does not live with during the thanksgiving holiday.       Current Medication: Outpatient Encounter Medications as of 10/24/2019  Medication Sig  . fluticasone (FLONASE) 50 MCG/ACT nasal spray Place 2 sprays into both nostrils as needed.   . rosuvastatin (CRESTOR) 20 MG tablet   . testosterone cypionate (DEPOTESTOSTERONE CYPIONATE) 200 MG/ML injection INJECT 0.75 ML EVERY 2  WEEKS  . azithromycin (ZITHROMAX) 250 MG tablet z-pack - take as directed for 5 days for sinusitis  . [DISCONTINUED] sildenafil (REVATIO) 20 MG tablet Take 3 to 5 tablets two hours before intercouse on an empty stomach.  Do not take with nitrates. (Patient not taking: Reported on 08/03/2019)  . [DISCONTINUED] tadalafil (CIALIS) 5 MG tablet Take 1 tablet (5 mg total) by mouth daily as needed for erectile dysfunction. (Patient not taking: Reported on 10/24/2019)   No facility-administered encounter medications on file as of 10/24/2019.     Surgical History: Past Surgical History:  Procedure Laterality Date  . NO PAST SURGERIES     verified with patient 10/22/17    Medical History: Past Medical History:  Diagnosis Date  . Low testosterone   . Sleep apnea     Family History: Family History  Problem Relation Age of Onset  . Stroke Father   . Diabetes Mother   . Healthy Daughter   . Kidney disease Neg Hx   . Prostate cancer Neg Hx   . Kidney cancer Neg Hx   . Bladder Cancer Neg Hx     Social History   Socioeconomic History  . Marital status: Married    Spouse name: Not on file  . Number of children: Not on file  . Years of education: Not on file  . Highest education level: Not on file  Occupational History  . Not on file  Social Needs  .  Financial resource strain: Not on file  . Food insecurity    Worry: Not on file    Inability: Not on file  . Transportation needs    Medical: Not on file    Non-medical: Not on file  Tobacco Use  . Smoking status: Never Smoker  . Smokeless tobacco: Never Used  Substance and Sexual Activity  . Alcohol use: Yes    Comment: 1-2 Beers Weekly  . Drug use: No  . Sexual activity: Not on file  Lifestyle  . Physical activity    Days per week: Not on file    Minutes per session: Not on file  . Stress: Not on file  Relationships  . Social Herbalist on phone: Not on file    Gets together: Not on file    Attends religious  service: Not on file    Active member of club or organization: Not on file    Attends meetings of clubs or organizations: Not on file    Relationship status: Not on file  . Intimate partner violence    Fear of current or ex partner: Not on file    Emotionally abused: Not on file    Physically abused: Not on file    Forced sexual activity: Not on file  Other Topics Concern  . Not on file  Social History Narrative  . Not on file      Review of Systems  Constitutional: Positive for chills, fatigue and fever. Negative for unexpected weight change.  HENT: Positive for congestion, postnasal drip, rhinorrhea, sinus pressure and sinus pain. Negative for sneezing and sore throat.   Respiratory: Positive for cough. Negative for chest tightness and shortness of breath.   Cardiovascular: Negative for chest pain and palpitations.  Gastrointestinal: Negative for abdominal pain, constipation, diarrhea, nausea and vomiting.  Musculoskeletal: Positive for myalgias. Negative for arthralgias, back pain, joint swelling and neck pain.  Skin: Negative for rash.  Neurological: Positive for headaches. Negative for tremors and numbness.       Sinus headaches  Hematological: Negative for adenopathy. Does not bruise/bleed easily.  Psychiatric/Behavioral: Negative for behavioral problems (Depression), sleep disturbance and suicidal ideas. The patient is not nervous/anxious.     Today's Vitals   10/24/19 0837  Weight: 289 lb (131.1 kg)  Height: 6\' 4"  (1.93 m)   Body mass index is 35.18 kg/m.  Observation/Objective:   The patient is alert and oriented. He is pleasant and answering all questions appropriately. Breathing is non-labored. He is in no acute distress. The patient is nasally congested and has mild, non-productive cough.   Assessment/Plan: 1. Acute non-recurrent pansinusitis Start z-pack. Take as directed for 5 days. Rest and increase fluids. Take OTC tylennol/ibuprofen for pain and fever.  If no improvement in next 24 to 48 hours, will test for COVID 19.  - azithromycin (ZITHROMAX) 250 MG tablet; z-pack - take as directed for 5 days for sinusitis  Dispense: 6 tablet; Refill: 0  General Counseling: Kendarrius verbalizes understanding of the findings of today's phone visit and agrees with plan of treatment. I have discussed any further diagnostic evaluation that may be needed or ordered today. We also reviewed his medications today. he has been encouraged to call the office with any questions or concerns that should arise related to todays visit.   Rest and increase fluids. Continue using OTC medication to control symptoms.   This patient was seen by Leretha Pol FNP Collaboration with Dr Lavera Guise as a  part of collaborative care agreement  Meds ordered this encounter  Medications  . azithromycin (ZITHROMAX) 250 MG tablet    Sig: z-pack - take as directed for 5 days for sinusitis    Dispense:  6 tablet    Refill:  0    Order Specific Question:   Supervising Provider    Answer:   Lyndon Code [[id number]]    Time spent: 58 Minutes    Dr Lyndon Code Internal medicine

## 2019-10-29 DIAGNOSIS — Z20828 Contact with and (suspected) exposure to other viral communicable diseases: Secondary | ICD-10-CM | POA: Diagnosis not present

## 2019-11-02 ENCOUNTER — Other Ambulatory Visit: Payer: BC Managed Care – PPO

## 2019-11-09 ENCOUNTER — Other Ambulatory Visit: Payer: BC Managed Care – PPO

## 2019-11-21 ENCOUNTER — Other Ambulatory Visit: Payer: Self-pay | Admitting: *Deleted

## 2019-11-21 DIAGNOSIS — E349 Endocrine disorder, unspecified: Secondary | ICD-10-CM

## 2019-11-22 ENCOUNTER — Other Ambulatory Visit: Payer: BC Managed Care – PPO

## 2019-11-22 ENCOUNTER — Other Ambulatory Visit: Payer: Self-pay

## 2019-11-22 DIAGNOSIS — E349 Endocrine disorder, unspecified: Secondary | ICD-10-CM

## 2019-11-23 LAB — HEMOGLOBIN AND HEMATOCRIT, BLOOD
Hematocrit: 45.9 % (ref 37.5–51.0)
Hemoglobin: 16 g/dL (ref 13.0–17.7)

## 2019-11-23 LAB — TESTOSTERONE: Testosterone: 878 ng/dL (ref 264–916)

## 2019-11-28 ENCOUNTER — Telehealth: Payer: Self-pay | Admitting: Family Medicine

## 2019-11-28 NOTE — Telephone Encounter (Signed)
Patient notified and voiced understanding.

## 2019-11-28 NOTE — Telephone Encounter (Signed)
-----   Message from Harle Battiest, PA-C sent at 11/28/2019 10:07 AM EST ----- Please let Robert Trevino know that his labs were normal and to keep on present dose of testosterone and we will see him in March.

## 2020-01-27 ENCOUNTER — Other Ambulatory Visit: Payer: Self-pay | Admitting: Urology

## 2020-01-31 ENCOUNTER — Other Ambulatory Visit: Payer: Self-pay

## 2020-01-31 DIAGNOSIS — E291 Testicular hypofunction: Secondary | ICD-10-CM

## 2020-02-01 ENCOUNTER — Other Ambulatory Visit: Payer: Self-pay

## 2020-02-01 ENCOUNTER — Other Ambulatory Visit: Payer: BC Managed Care – PPO

## 2020-02-01 DIAGNOSIS — E291 Testicular hypofunction: Secondary | ICD-10-CM

## 2020-02-01 DIAGNOSIS — E349 Endocrine disorder, unspecified: Secondary | ICD-10-CM

## 2020-02-02 LAB — HEMOGLOBIN: Hemoglobin: 15.3 g/dL (ref 13.0–17.7)

## 2020-02-02 LAB — PSA: Prostate Specific Ag, Serum: 0.6 ng/mL (ref 0.0–4.0)

## 2020-02-02 LAB — HEMATOCRIT: Hematocrit: 44.3 % (ref 37.5–51.0)

## 2020-02-02 LAB — TESTOSTERONE: Testosterone: 729 ng/dL (ref 264–916)

## 2020-02-08 ENCOUNTER — Encounter: Payer: Self-pay | Admitting: Urology

## 2020-02-08 ENCOUNTER — Ambulatory Visit (INDEPENDENT_AMBULATORY_CARE_PROVIDER_SITE_OTHER): Payer: BC Managed Care – PPO | Admitting: Urology

## 2020-02-08 ENCOUNTER — Other Ambulatory Visit: Payer: Self-pay

## 2020-02-08 VITALS — BP 130/87 | HR 67 | Ht 76.0 in | Wt 290.0 lb

## 2020-02-08 DIAGNOSIS — N138 Other obstructive and reflux uropathy: Secondary | ICD-10-CM | POA: Diagnosis not present

## 2020-02-08 DIAGNOSIS — N529 Male erectile dysfunction, unspecified: Secondary | ICD-10-CM

## 2020-02-08 DIAGNOSIS — N401 Enlarged prostate with lower urinary tract symptoms: Secondary | ICD-10-CM

## 2020-02-08 DIAGNOSIS — E349 Endocrine disorder, unspecified: Secondary | ICD-10-CM

## 2020-02-08 NOTE — Progress Notes (Signed)
02/02/2019 3:51 PM   Robert Trevino 1971-10-09 [id number]  Referring provider: Carlean Jews, NP [address] [place],  [place] [address]   Chief Complaint  Patient presents with  . Hypogonadism    78mo f/u   HPI: Robert Trevino is a 49 y.o. male with testosterone deficiency, erectile dysfunction and BPH with LU TS who presents today for a follow up.    Testosterone deficiency He is no longer having spontaneous erections at night.  He has sleep apnea and is sleeping with a CPAP machine when he can.  His most recent testosterone is 729 ng/dL on 44/81/8563.  He is currently managing his testosterone deficiency with testosterone cypionate 200 mg/mL, 0.75 cc every two weeks.  His HCT 44.3% and Hgb is 15.3.    Erectile dysfunction His SHIM score is 17, which is mild ED.  His previous SHIM score was 18.  He has been having difficulty with erections for the last few years.   His major complaint is getting an erection.  His libido is preserved.  His risk factors for ED are age, BPH, testosterone deficiency, and sleep apnea (for which he sleeps with a CPAP machine).  He denies any painful erections or curvatures with his erections.  He is no longer having spontaneous erections.  He is finding the Cialis 5 mg daily helpful with erections.   SHIM    Row Name 02/08/20 1550         SHIM: Over the last 6 months:   How do you rate your confidence that you could get and keep an erection?  Low     When you had erections with sexual stimulation, how often were your erections hard enough for penetration (entering your partner)?  Most Times (much more than half the time)     During sexual intercourse, how often were you able to maintain your erection after you had penetrated (entered) your partner?  Sometimes (about half the time)     During sexual intercourse, how difficult was it to maintain your erection to completion of intercourse?  Slightly Difficult     When you attempted sexual intercourse,  how often was it satisfactory for you?  Most Times (much more than half the time)       SHIM Total Score   SHIM  17       Score: 1-7 Severe ED 8-11 Moderate ED 12-16 Mild-Moderate ED 17-21 Mild ED 22-25 No ED  BPH WITH LUTS  (prostate and/or bladder) IPSS score: 2/0     Previous score: 4/1 Denies any dysuria, hematuria or suprapubic pain.  Denies any recent fevers, chills, nausea or vomiting.  He does not have a family history of PCa.  IPSS    Row Name 02/08/20 1500         International Prostate Symptom Score   How often have you had the sensation of not emptying your bladder?  Not at All     How often have you had to urinate less than every two hours?  Less than 1 in 5 times     How often have you found you stopped and started again several times when you urinated?  Not at All     How often have you found it difficult to postpone urination?  Not at All     How often have you had a weak urinary stream?  Not at All     How often have you had to strain to start  urination?  Not at All     How many times did you typically get up at night to urinate?  1 Time     Total IPSS Score  2       Quality of Life due to urinary symptoms   If you were to spend the rest of your life with your urinary condition just the way it is now how would you feel about that?  Delighted       Score:  1-7 Mild 8-19 Moderate 20-35 Severe  PMH: Past Medical History:  Diagnosis Date  . Low testosterone   . Sleep apnea     Surgical History: Past Surgical History:  Procedure Laterality Date  . NO PAST SURGERIES     verified with patient 10/22/17    Home Medications:  Allergies as of 02/08/2020   No Known Allergies     Medication List       Accurate as of February 08, 2020  3:51 PM. If you have any questions, ask your nurse or doctor.        STOP taking these medications   azithromycin 250 MG tablet Commonly known as: ZITHROMAX Stopped by: Zara Council, PA-C     TAKE these  medications   fluticasone 50 MCG/ACT nasal spray Commonly known as: FLONASE Place 2 sprays into both nostrils as needed.   rosuvastatin 20 MG tablet Commonly known as: CRESTOR   tadalafil 5 MG tablet Commonly known as: CIALIS TAKE ONE TABLET BY MOUTH DAILY AS NEEDED FOR ERECTILE DYSFUNCTION   testosterone cypionate 200 MG/ML injection Commonly known as: DEPOTESTOSTERONE CYPIONATE INJECT 0.75 ML EVERY 2 WEEKS       Allergies: No Known Allergies  Family History: Family History  Problem Relation Age of Onset  . Stroke Father   . Diabetes Mother   . Healthy Daughter   . Kidney disease Neg Hx   . Prostate cancer Neg Hx   . Kidney cancer Neg Hx   . Bladder Cancer Neg Hx     Social History:  reports that he has never smoked. He has never used smokeless tobacco. He reports current alcohol use. He reports that he does not use drugs.  ROS: For pertinent review of systems please refer to history of present illness  Physical Exam: BP 130/87   Pulse 67   Ht 6\' 4"  (1.93 m)   Wt 290 lb (131.5 kg)   BMI 35.30 kg/m   Constitutional:  Well nourished. Alert and oriented, No acute distress. HEENT: [place] AT, mask in place.  Trachea midline, no masses. Cardiovascular: No clubbing, cyanosis, or edema. Respiratory: Normal respiratory effort, no increased work of breathing. GI: Abdomen is soft, non tender, non distended, no abdominal masses. Liver and spleen not palpable.  No hernias appreciated.  Stool sample for occult testing is not indicated.   GU: No CVA tenderness.  No bladder fullness or masses.  Patient with circumcised phallus.  Urethral meatus is patent.  No penile discharge. No penile lesions or rashes. Scrotum without lesions, cysts, rashes and/or edema.  Testicles are located scrotally bilaterally. No masses are appreciated in the testicles. Left and right epididymis are normal. Rectal: Patient with  normal sphincter tone. Anus and perineum without scarring or rashes. No rectal  masses are appreciated. Prostate is approximately 50 grams, no nodules are appreciated. Seminal vesicles could not be palpated Skin: No rashes, bruises or suspicious lesions. Lymph: No inguinal adenopathy. Neurologic: Grossly intact, no focal deficits, moving all 4 extremities. Psychiatric: Normal  mood and affect.  Laboratory Data: Lab Results  Component Value Date   WBC 7.8 05/30/2019   HGB 15.3 02/01/2020   HCT 44.3 02/01/2020   MCV 90 05/30/2019   PLT 302 05/30/2019   PSA History  0.7 ng/mL on 05/01/2016  0.6 ng/mLon 12/16/2016  0.7 ng/mL in 10/2017  0.7 ng/mL in 03/2018  0.6 ng/mL in 10/2018  0.6 ng/mL in 05/2019  0.6 ng/mL in 01/2020  Lab Results  Component Value Date   TESTOSTERONE 729 02/01/2020   I have reviewed the labs.  Assessment & Plan:    1. Testosterone deficiency  Testosterone is at therapeutic levels Continue testosterone cypionate 200 mg/mL , 0.75 cc IM every two weeks  RTC in 3 months for HCT/HBG and testosterone (one week after an injection)  2. BPH with LUTS - IPSS score is 2/0, it has improved  - Continue conservative management, avoiding bladder irritants and timed voiding's - RTC in 6 months for IPSS, PSA and exam, as testosterone therapy can cause prostate enlargement and worsen LUTS  3. Erectile dysfunction:    SHIM score is 18, it is improved Continue the Cialis 5 mg daily - refills given  RTC in 6 months for SHIM score and exam, as testosterone therapy can affect erections  Return in about 3 months (around 05/10/2020) for testosterone (one week after injection) H & H on labs .  Michiel Cowboy, PA-C  [facility] [address] [place], [place] [address] [phone]

## 2020-03-13 ENCOUNTER — Telehealth: Payer: Self-pay

## 2020-03-13 NOTE — Telephone Encounter (Signed)
Confirmed and screened for 03-15-20 ov. 

## 2020-03-15 ENCOUNTER — Encounter: Payer: Self-pay | Admitting: Nurse Practitioner

## 2020-03-15 ENCOUNTER — Ambulatory Visit: Payer: BC Managed Care – PPO | Admitting: Nurse Practitioner

## 2020-03-15 ENCOUNTER — Other Ambulatory Visit: Payer: Self-pay

## 2020-03-15 VITALS — BP 135/91 | HR 70 | Temp 96.7°F | Resp 16 | Ht 76.0 in | Wt 291.0 lb

## 2020-03-15 DIAGNOSIS — I872 Venous insufficiency (chronic) (peripheral): Secondary | ICD-10-CM

## 2020-03-15 DIAGNOSIS — I83893 Varicose veins of bilateral lower extremities with other complications: Secondary | ICD-10-CM | POA: Diagnosis not present

## 2020-03-15 DIAGNOSIS — L209 Atopic dermatitis, unspecified: Secondary | ICD-10-CM | POA: Insufficient documentation

## 2020-03-15 DIAGNOSIS — I8311 Varicose veins of right lower extremity with inflammation: Secondary | ICD-10-CM | POA: Insufficient documentation

## 2020-03-15 MED ORDER — TRIAMCINOLONE ACETONIDE 0.025 % EX CREA: 1.0000 "application " | TOPICAL_CREAM | Freq: Two times a day (BID) | CUTANEOUS | 2 refills | Status: DC

## 2020-03-15 NOTE — Progress Notes (Signed)
[facility] [place], [place] [address]  Internal MEDICINE  Office Visit Note  Patient Name: Robert Trevino  [id number]  [id number]  Date of Service: 03/15/2020  Chief Complaint  Patient presents with  . Circulatory Problem    legs   . Referral     The patient is here for acute visit. He has significant rash on the left lower leg. Is very red and itchy. Had this looked at several years back by dermatologist who gave him topical preparation which helps some, but once he stops using it, it comes right back. He has history of severe varicose veins with pain in the left lower leg. Actually had vein stripped many year ago by [facility]. He has tried using compression socks, after initial visit with dermatology. He stopped doing this because it is so hot at his work and he is on his feel for long periods of time. He also has smaller, less irritating rash on the right lower extremity.   Pt is here for a sick visit.     Current Medication:  Outpatient Encounter Medications as of 03/15/2020  Medication Sig  . fluticasone (FLONASE) 50 MCG/ACT nasal spray Place 2 sprays into both nostrils as needed.   . rosuvastatin (CRESTOR) 20 MG tablet   . tadalafil (CIALIS) 5 MG tablet TAKE ONE TABLET BY MOUTH DAILY AS NEEDED FOR ERECTILE DYSFUNCTION  . testosterone cypionate (DEPOTESTOSTERONE CYPIONATE) 200 MG/ML injection INJECT 0.75 ML EVERY 2 WEEKS  . triamcinolone (KENALOG) 0.025 % cream Apply 1 application topically 2 (two) times daily.   No facility-administered encounter medications on file as of 03/15/2020.      Medical History: Past Medical History:  Diagnosis Date  . Low testosterone   . Sleep apnea     Today's Vitals   03/15/20 0910  BP: (!) 135/91  Pulse: 70  Resp: 16  Temp: (!) 96.7 F (35.9 C)  SpO2: 97%  Weight: 291 lb (132 kg)  Height: 6\' 4"  (1.93 m)   Body mass index is 35.42 kg/m.  Review of Systems  Constitutional:  Negative for activity change, chills, fatigue and unexpected weight change.  HENT: Negative for congestion, postnasal drip, rhinorrhea, sneezing and sore throat.   Respiratory: Negative for cough, chest tightness and shortness of breath.   Cardiovascular: Positive for leg swelling. Negative for chest pain and palpitations.  Gastrointestinal: Negative for abdominal pain, constipation, diarrhea, nausea and vomiting.  Endocrine: Negative for cold intolerance, heat intolerance, polydipsia and polyuria.  Musculoskeletal: Negative for arthralgias, back pain, joint swelling and neck pain.  Skin: Positive for rash.       There is red, irritated rash on left lower leg. This is large patc on front of his leg. Smaller patch of similar rash on right lower leg. Both very itchy  Allergic/Immunologic: Negative for environmental allergies.  Neurological: Negative for dizziness, tremors, numbness and headaches.  Hematological: Negative for adenopathy. Does not bruise/bleed easily.  Psychiatric/Behavioral: Negative for behavioral problems (Depression), sleep disturbance and suicidal ideas. The patient is not nervous/anxious.     Physical Exam Vitals and nursing note reviewed.  Constitutional:      General: He is not in acute distress.    Appearance: Normal appearance. He is well-developed. He is not diaphoretic.  HENT:     Head: Normocephalic and atraumatic.     Mouth/Throat:     Pharynx: No oropharyngeal exudate.  Eyes:     Pupils: Pupils are equal, round, and reactive  to light.  Neck:     Thyroid: No thyromegaly.     Vascular: No JVD.     Trachea: No tracheal deviation.  Cardiovascular:     Rate and Rhythm: Normal rate and regular rhythm.     Heart sounds: Normal heart sounds. No murmur. No friction rub. No gallop.      Comments: Varicose veins with swelling, bilaterally.  Pulmonary:     Effort: Pulmonary effort is normal. No respiratory distress.     Breath sounds: No wheezing or rales.  Chest:      Chest wall: No tenderness.  Abdominal:     Palpations: Abdomen is soft.  Musculoskeletal:        General: Normal range of motion.     Cervical back: Normal range of motion and neck supple.  Lymphadenopathy:     Cervical: No cervical adenopathy.  Skin:    General: Skin is warm and dry.     Findings: Erythema and rash present.     Comments: Left lower extremity has patch, red, irritated rash. Area is warm and swollen. Some scabbing is present. Smaller, similar appearing area on the right lower leg.   Neurological:     Mental Status: He is alert and oriented to person, place, and time.     Cranial Nerves: No cranial nerve deficit.  Psychiatric:        Behavior: Behavior normal.        Thought Content: Thought content normal.        Judgment: Judgment normal.   Assessment/Plan: 1. Atopic dermatitis, unspecified type Add triamcinolone cream. Apply to affected area twice daliy to reduce itching and irritation.   2. Venous stasis dermatitis of both lower extremities Add triamcinolone cream. Apply to affected area twice daliy to reduce itching and irritation.  Advised he start to wear compression socks to work every day. Will get vascular ultrasound of both lower legs for further evaluation of venous reflux/venous stasis.  - VAS [place] LOWER EXTREMITY VENOUS REFLUX; Future - triamcinolone (KENALOG) 0.025 % cream; Apply 1 application topically 2 (two) times daily.  Dispense: 80 g; Refill: 2  3. Varicose veins of both legs with edema  Advised he start to wear compression socks to work every day. Will get vascular ultrasound of both lower legs for further evaluation of venous reflux/venous stasis.  - VAS [place] LOWER EXTREMITY VENOUS REFLUX; Future   General Counseling: Jayren verbalizes understanding of the findings of todays visit and agrees with plan of treatment. I have discussed any further diagnostic evaluation that may be needed or ordered today. We also reviewed his medications today. he  has been encouraged to call the office with any questions or concerns that should arise related to todays visit.    Counseling:  This patient was seen by Vincent Gros FNP Collaboration with Dr Lyndon Code as a part of collaborative care agreement  Orders Placed This Encounter  Procedures  . VAS [place] LOWER EXTREMITY VENOUS REFLUX    Meds ordered this encounter  Medications  . triamcinolone (KENALOG) 0.025 % cream    Sig: Apply 1 application topically 2 (two) times daily.    Dispense:  80 g    Refill:  2    Order Specific Question:   Supervising Provider    Answer:   Lyndon Code [[id number]]    Time spent: 30 Minutes

## 2020-04-03 ENCOUNTER — Other Ambulatory Visit: Payer: Self-pay | Admitting: Urology

## 2020-04-04 ENCOUNTER — Telehealth: Payer: Self-pay | Admitting: Urology

## 2020-04-04 ENCOUNTER — Telehealth: Payer: Self-pay

## 2020-04-04 ENCOUNTER — Other Ambulatory Visit: Payer: Self-pay | Admitting: Urology

## 2020-04-04 NOTE — Telephone Encounter (Signed)
Pt requesting refill of testosterone

## 2020-04-04 NOTE — Telephone Encounter (Signed)
Confirmed appointment on 04/06/2020 and screened for covid. klh

## 2020-04-06 ENCOUNTER — Other Ambulatory Visit: Payer: Self-pay

## 2020-04-06 ENCOUNTER — Ambulatory Visit: Payer: BC Managed Care – PPO

## 2020-04-06 DIAGNOSIS — I872 Venous insufficiency (chronic) (peripheral): Secondary | ICD-10-CM

## 2020-04-06 DIAGNOSIS — I83893 Varicose veins of bilateral lower extremities with other complications: Secondary | ICD-10-CM

## 2020-04-11 ENCOUNTER — Telehealth: Payer: Self-pay

## 2020-04-11 NOTE — Telephone Encounter (Signed)
Lmom to confirm and screen for 04-13-20 ov. 

## 2020-04-12 ENCOUNTER — Telehealth: Payer: Self-pay

## 2020-04-12 NOTE — Telephone Encounter (Signed)
Confirmed appointment on 04/13/2020 and screened for covid. klh °

## 2020-04-13 ENCOUNTER — Encounter: Payer: Self-pay | Admitting: Nurse Practitioner

## 2020-04-13 ENCOUNTER — Other Ambulatory Visit: Payer: Self-pay

## 2020-04-13 ENCOUNTER — Ambulatory Visit: Payer: BC Managed Care – PPO | Admitting: Nurse Practitioner

## 2020-04-13 VITALS — BP 134/81 | HR 73 | Temp 97.4°F | Resp 16 | Ht 76.0 in | Wt 290.8 lb

## 2020-04-13 DIAGNOSIS — I83893 Varicose veins of bilateral lower extremities with other complications: Secondary | ICD-10-CM

## 2020-04-13 DIAGNOSIS — L209 Atopic dermatitis, unspecified: Secondary | ICD-10-CM | POA: Diagnosis not present

## 2020-04-13 MED ORDER — TRIAMCINOLONE ACETONIDE 0.1 % EX CREA: 1.0000 "application " | TOPICAL_CREAM | Freq: Two times a day (BID) | CUTANEOUS | 2 refills | Status: DC

## 2020-04-13 NOTE — Progress Notes (Signed)
[facility] [address] [place], [place] [address]  Internal MEDICINE  Office Visit Note  Patient Name: Robert Trevino  [id number]  [id number]  Date of Service: 04/18/2020  Chief Complaint  Patient presents with  . Follow-up    [place] results    The patient was seen at his most recent visit for rash on the left lower leg. Is very red and itchy. Had this looked at several years back by dermatologist who gave him topical preparation which helps some, but once he stops using it, it comes right back. He has history of severe varicose veins with pain in the left lower leg. Actually had vein stripped many year ago by [facility]. He has tried using compression socks. There had been some edema present and new varicose veins present in the left lower extremity as well. Since his last visit, he did have venous doppler of the left lower leg. There was no evidence of DVT. There is new, superficial varicosity of the left medial ankle which appears patent. This may be causing swelling and potentially worsening the rash.       Current Medication: Outpatient Encounter Medications as of 04/13/2020  Medication Sig  . fluticasone (FLONASE) 50 MCG/ACT nasal spray Place 2 sprays into both nostrils as needed.   . rosuvastatin (CRESTOR) 20 MG tablet   . tadalafil (CIALIS) 5 MG tablet TAKE ONE TABLET BY MOUTH DAILY AS NEEDED FOR ERECTILE DYSFUNCTION  . testosterone cypionate (DEPOTESTOSTERONE CYPIONATE) 200 MG/ML injection INJECT 0.75MLS EVERY 2 WEEKS  . [DISCONTINUED] triamcinolone (KENALOG) 0.025 % cream Apply 1 application topically 2 (two) times daily.  Marland Kitchen triamcinolone cream (KENALOG) 0.1 % Apply 1 application topically 2 (two) times daily.   No facility-administered encounter medications on file as of 04/13/2020.    Surgical History: Past Surgical History:  Procedure Laterality Date  . NO PAST SURGERIES     verified with patient 10/22/17    Medical History: Past Medical  History:  Diagnosis Date  . Low testosterone   . Sleep apnea     Family History: Family History  Problem Relation Age of Onset  . Stroke Father   . Diabetes Mother   . Healthy Daughter   . Kidney disease Neg Hx   . Prostate cancer Neg Hx   . Kidney cancer Neg Hx   . Bladder Cancer Neg Hx     Social History   Socioeconomic History  . Marital status: Married    Spouse name: Not on file  . Number of children: Not on file  . Years of education: Not on file  . Highest education level: Not on file  Occupational History  . Not on file  Tobacco Use  . Smoking status: Never Smoker  . Smokeless tobacco: Never Used  Substance and Sexual Activity  . Alcohol use: Yes    Comment: 1-2 Beers Weekly  . Drug use: No  . Sexual activity: Not on file  Other Topics Concern  . Not on file  Social History Narrative  . Not on file   Social Determinants of Health   Financial Resource Strain:   . Difficulty of Paying Living Expenses:   Food Insecurity:   . Worried About Programme researcher, broadcasting/film/video in the Last Year:   . Barista in the Last Year:   Transportation Needs:   . Freight forwarder (Medical):   Marland Kitchen Lack of Transportation (Non-Medical):   Physical Activity:   . Days of  Exercise per Week:   . Minutes of Exercise per Session:   Stress:   . Feeling of Stress :   Social Connections:   . Frequency of Communication with Friends and Family:   . Frequency of Social Gatherings with Friends and Family:   . Attends Religious Services:   . Active Member of Clubs or Organizations:   . Attends Banker Meetings:   Marland Kitchen Marital Status:   Intimate Partner Violence:   . Fear of Current or Ex-Partner:   . Emotionally Abused:   Marland Kitchen Physically Abused:   . Sexually Abused:       Review of Systems  Constitutional: Negative for activity change, chills, fatigue and unexpected weight change.  HENT: Negative for congestion, postnasal drip, rhinorrhea, sneezing and sore throat.    Respiratory: Negative for cough, chest tightness and shortness of breath.   Cardiovascular: Positive for leg swelling. Negative for chest pain and palpitations.       Varicose veins present on left lower extremity.   Gastrointestinal: Negative for abdominal pain, constipation, diarrhea, nausea and vomiting.  Endocrine: Negative for cold intolerance, heat intolerance, polydipsia and polyuria.  Musculoskeletal: Negative for arthralgias, back pain, joint swelling and neck pain.  Skin: Positive for rash.       There is red, irritated rash on left lower leg. This is large patc on front of his leg. Smaller patch of similar rash on right lower leg. Both very itchy  Allergic/Immunologic: Negative for environmental allergies.  Neurological: Negative for dizziness, tremors, numbness and headaches.  Hematological: Negative for adenopathy. Does not bruise/bleed easily.  Psychiatric/Behavioral: Negative for behavioral problems (Depression), sleep disturbance and suicidal ideas. The patient is not nervous/anxious.     Today's Vitals   04/13/20 1526  BP: 134/81  Pulse: 73  Resp: 16  Temp: (!) 97.4 F (36.3 C)  SpO2: 97%  Weight: 290 lb 12.8 oz (131.9 kg)  Height: 6\' 4"  (1.93 m)   Body mass index is 35.4 kg/m.  Physical Exam Vitals and nursing note reviewed.  Constitutional:      General: He is not in acute distress.    Appearance: Normal appearance. He is well-developed. He is not diaphoretic.  HENT:     Head: Normocephalic and atraumatic.     Mouth/Throat:     Pharynx: No oropharyngeal exudate.  Eyes:     Pupils: Pupils are equal, round, and reactive to light.  Neck:     Thyroid: No thyromegaly.     Vascular: No JVD.     Trachea: No tracheal deviation.  Cardiovascular:     Rate and Rhythm: Normal rate and regular rhythm.     Heart sounds: Normal heart sounds. No murmur. No friction rub. No gallop.      Comments: Varicose veins with swelling, bilaterally.  Pulmonary:     Effort:  Pulmonary effort is normal. No respiratory distress.     Breath sounds: Normal breath sounds. No wheezing or rales.  Chest:     Chest wall: No tenderness.  Abdominal:     Palpations: Abdomen is soft.  Musculoskeletal:        General: Normal range of motion.     Cervical back: Normal range of motion and neck supple.  Lymphadenopathy:     Cervical: No cervical adenopathy.  Skin:    General: Skin is warm and dry.     Findings: Erythema and rash present.     Comments: Left lower extremity has patch, red, irritated rash. Area is  warm and swollen. Some scabbing is present. Smaller, similar appearing area on the right lower leg.   Neurological:     Mental Status: He is alert and oriented to person, place, and time.     Cranial Nerves: No cranial nerve deficit.  Psychiatric:        Behavior: Behavior normal.        Thought Content: Thought content normal.        Judgment: Judgment normal.   Assessment/Plan: 1. Varicose veins of both legs with edema Reviewed venous ultrasound of left lower leg. Showed no evidence of DVT. There is new, superficial varicosity of the left medial ankle which appears patent. This may be causing swelling and potentially worsening the rash. Refer to [facility] for further evaluation and treatment.  - Ambulatory referral to Vascular Surgery  2. Atopic dermatitis, unspecified type Changed topical treatment to triamcinolone 0.1% ointment. This should be applied to affected area twice daily as needed.  - triamcinolone cream (KENALOG) 0.1 %; Apply 1 application topically 2 (two) times daily.  Dispense: 45 g; Refill: 2  General Counseling: Robert Trevino verbalizes understanding of the findings of todays visit and agrees with plan of treatment. I have discussed any further diagnostic evaluation that may be needed or ordered today. We also reviewed his medications today. he has been encouraged to call the office with any questions or concerns that should arise  related to todays visit.  This patient was seen by Leretha Pol FNP Collaboration with Dr Lavera Guise as a part of collaborative care agreement  Orders Placed This Encounter  Procedures  . Ambulatory referral to Vascular Surgery    Meds ordered this encounter  Medications  . triamcinolone cream (KENALOG) 0.1 %    Sig: Apply 1 application topically 2 (two) times daily.    Dispense:  45 g    Refill:  2    Please note change in concentration of cream to ointment    Order Specific Question:   Supervising Provider    Answer:   Lavera Guise [[id number]]    Total time spent: 30 Minutes   Time spent includes review of chart, medications, test results, and follow up plan with the patient.      Dr Lavera Guise Internal medicine

## 2020-04-24 ENCOUNTER — Other Ambulatory Visit: Payer: Self-pay

## 2020-04-24 DIAGNOSIS — E349 Endocrine disorder, unspecified: Secondary | ICD-10-CM

## 2020-04-25 ENCOUNTER — Other Ambulatory Visit: Payer: Self-pay

## 2020-04-25 ENCOUNTER — Other Ambulatory Visit: Payer: BC Managed Care – PPO

## 2020-04-25 ENCOUNTER — Other Ambulatory Visit (INDEPENDENT_AMBULATORY_CARE_PROVIDER_SITE_OTHER): Payer: Self-pay | Admitting: Nurse Practitioner

## 2020-04-25 DIAGNOSIS — E349 Endocrine disorder, unspecified: Secondary | ICD-10-CM

## 2020-04-25 DIAGNOSIS — I83899 Varicose veins of unspecified lower extremities with other complications: Secondary | ICD-10-CM

## 2020-04-26 ENCOUNTER — Ambulatory Visit (INDEPENDENT_AMBULATORY_CARE_PROVIDER_SITE_OTHER): Payer: BC Managed Care – PPO

## 2020-04-26 ENCOUNTER — Encounter (INDEPENDENT_AMBULATORY_CARE_PROVIDER_SITE_OTHER): Payer: Self-pay | Admitting: Nurse Practitioner

## 2020-04-26 ENCOUNTER — Ambulatory Visit (INDEPENDENT_AMBULATORY_CARE_PROVIDER_SITE_OTHER): Payer: BC Managed Care – PPO | Admitting: Nurse Practitioner

## 2020-04-26 VITALS — BP 127/65 | HR 65 | Ht 77.0 in | Wt 286.0 lb

## 2020-04-26 DIAGNOSIS — E782 Mixed hyperlipidemia: Secondary | ICD-10-CM

## 2020-04-26 DIAGNOSIS — I8311 Varicose veins of right lower extremity with inflammation: Secondary | ICD-10-CM

## 2020-04-26 DIAGNOSIS — I8312 Varicose veins of left lower extremity with inflammation: Secondary | ICD-10-CM

## 2020-04-26 DIAGNOSIS — I872 Venous insufficiency (chronic) (peripheral): Secondary | ICD-10-CM | POA: Diagnosis not present

## 2020-04-26 DIAGNOSIS — I83899 Varicose veins of unspecified lower extremities with other complications: Secondary | ICD-10-CM

## 2020-04-26 LAB — TESTOSTERONE: Testosterone: 593 ng/dL (ref 264–916)

## 2020-04-26 NOTE — Progress Notes (Signed)
Subjective:    Patient ID: Robert Trevino, male    DOB: March 12, 1971, 49 y.o.   MRN: [id number] Chief Complaint  Patient presents with  . New Patient (Initial Visit)    Varicose veins W/ Edema    The patient is seen for evaluation of symptomatic varicose veins. The patient relates burning and stinging which worsened steadily throughout the course of the day, particularly with standing.  The patient currently works Holiday representative and notes that the itching is very intense especially in his left lower extremity.  The days where it is very hot the itching is intensified.  The patient had his left lower extremity treated over 10 years ago however he noted that the itching, swelling and discomfort returned approximately 2 years ago.  The patient has been wearing compression stockings on a daily basis however despite this his symptoms persist.  The patient notes that the symptoms are significantly improved with elevation.  He also notes throbbing and aching over his varicosity particularly with prolonged dependent positions.  It has now gone to the point where his symptoms are persistent and severe enough that they are having a negative impact on his lifestyle as well as his daily activities such as with work, daily exercise, shopping as well as household maintenance.  There is no history of DVT, PE or superficial thrombophlebitis.   He denies any history of ulceration or hemorrhage. He denies any significant family history of varicose veins.  Today noninvasive studies show that the patient has reflux in the right common femoral vein, with no evidence of deep venous insufficiency of the left lower extremity.  The right lower extremity has evidence of reflux in the great saphenous vein at the proximal thigh extending to the proximal calf.  The left lower extremity has evidence of reflux in the great saphenous vein at the saphenofemoral junction.  The great saphenous vein at the proximal thigh extending to the  knee shows evidence of a prior ablation/stripping.  However it is also noted that the left great saphenous vein has been closed 1.5 cm past the saphenofemoral junction.  In the distal thigh area there is a large superficial varicosity that is fed from a perforator out of the deep system.  There is no evidence of DVT or superficial venous thrombosis bilaterally.   Review of Systems  Cardiovascular: Positive for leg swelling.  Skin: Positive for rash (Stasis dermatitis).       Itching  All other systems reviewed and are negative.      Objective:   Physical Exam Vitals reviewed.  HENT:     Head: Normocephalic.  Cardiovascular:     Rate and Rhythm: Normal rate and regular rhythm.     Pulses:          Dorsalis pedis pulses are 1+ on the right side and 1+ on the left side.       Posterior tibial pulses are 1+ on the right side and 1+ on the left side.     Heart sounds: Normal heart sounds.     Comments: Scattered varicosities bilaterally with large superficial varicosity on the left lower extremity measuring 3 to 4 mm Pulmonary:     Effort: Pulmonary effort is normal.     Breath sounds: Normal breath sounds.  Musculoskeletal:     Right lower leg: 2+ Edema present.     Left lower leg: 2+ Edema present.  Neurological:     Mental Status: He is alert and oriented to person, place,  and time.  Psychiatric:        Mood and Affect: Mood normal.        Behavior: Behavior normal.        Thought Content: Thought content normal.        Judgment: Judgment normal.     BP 127/65   Pulse 65   Ht 6\' 5"  (1.956 m)   Wt 286 lb (129.7 kg)   BMI 33.91 kg/m   Past Medical History:  Diagnosis Date  . Low testosterone   . Sleep apnea     Social History   Socioeconomic History  . Marital status: Married    Spouse name: Not on file  . Number of children: Not on file  . Years of education: Not on file  . Highest education level: Not on file  Occupational History  . Not on file  Tobacco  Use  . Smoking status: Never Smoker  . Smokeless tobacco: Never Used  Vaping Use  . Vaping Use: Never used  Substance and Sexual Activity  . Alcohol use: Yes    Comment: 1-2 Beers Weekly  . Drug use: No  . Sexual activity: Not on file  Other Topics Concern  . Not on file  Social History Narrative  . Not on file   Social Determinants of Health   Financial Resource Strain:   . Difficulty of Paying Living Expenses:   Food Insecurity:   . Worried About Charity fundraiser in the Last Year:   . Arboriculturist in the Last Year:   Transportation Needs:   . Film/video editor (Medical):   Marland Kitchen Lack of Transportation (Non-Medical):   Physical Activity:   . Days of Exercise per Week:   . Minutes of Exercise per Session:   Stress:   . Feeling of Stress :   Social Connections:   . Frequency of Communication with Friends and Family:   . Frequency of Social Gatherings with Friends and Family:   . Attends Religious Services:   . Active Member of Clubs or Organizations:   . Attends Archivist Meetings:   Marland Kitchen Marital Status:   Intimate Partner Violence:   . Fear of Current or Ex-Partner:   . Emotionally Abused:   Marland Kitchen Physically Abused:   . Sexually Abused:     Past Surgical History:  Procedure Laterality Date  . NO PAST SURGERIES     verified with patient 10/22/17    Family History  Problem Relation Age of Onset  . Stroke Father   . Diabetes Mother   . Healthy Daughter   . Kidney disease Neg Hx   . Prostate cancer Neg Hx   . Kidney cancer Neg Hx   . Bladder Cancer Neg Hx     No Known Allergies     Assessment & Plan:   1. Varicose veins of both lower extremities with inflammation Recommend:  The patient has had successful ablation of the previously incompetent saphenous venous system but still has persistent symptoms of pain and swelling that are having a negative impact on daily life and daily activities.  Patient should undergo injection foam  sclerotherapy to treat the residual varicosities.  The risks, benefits and alternative therapies were reviewed in detail with the patient.  All questions were answered.  The patient agrees to proceed with foam sclerotherapy at their convenience for their left lower extremity.  The patient will continue wearing the graduated compression stockings and using the over-the-counter pain medications  to treat her symptoms.   Discussion about ablation for right lower extremity will take place once left lower extremity has been treated.      2. Venous stasis dermatitis of both lower extremities Patient currently uses triamcinolone given to him by his primary care provider.  Patient is instructed to continue to utilize that.  Patient also advised to try over-the-counter Claritin daily for pruritus relief.  3. Mixed hyperlipidemia Continue statin as ordered and reviewed, no changes at this time    Current Outpatient Medications on File Prior to Visit  Medication Sig Dispense Refill  . fluticasone (FLONASE) 50 MCG/ACT nasal spray Place 2 sprays into both nostrils as needed.     . rosuvastatin (CRESTOR) 20 MG tablet   0  . tadalafil (CIALIS) 5 MG tablet TAKE ONE TABLET BY MOUTH DAILY AS NEEDED FOR ERECTILE DYSFUNCTION 90 tablet 2  . testosterone cypionate (DEPOTESTOSTERONE CYPIONATE) 200 MG/ML injection INJECT 0.75MLS EVERY 2 WEEKS 10 mL 1  . triamcinolone cream (KENALOG) 0.1 % Apply 1 application topically 2 (two) times daily. 45 g 2   No current facility-administered medications on file prior to visit.    There are no Patient Instructions on file for this visit. No follow-ups on file.   Georgiana Spinner, NP

## 2020-05-07 ENCOUNTER — Telehealth: Payer: Self-pay | Admitting: Family Medicine

## 2020-05-07 NOTE — Telephone Encounter (Signed)
Patient notified and voiced understanding.

## 2020-05-07 NOTE — Telephone Encounter (Signed)
-----   Message from Harle Battiest, PA-C sent at 05/06/2020  6:44 PM EDT ----- Please let Robert Trevino know that his testosterone level was within therapeutic range.  A Hematocrit and hemoglobin was not drawn at the same time as ordered.  We must make sure it is drawn at his appointment in September.

## 2020-07-24 ENCOUNTER — Other Ambulatory Visit: Payer: BC Managed Care – PPO

## 2020-07-24 ENCOUNTER — Other Ambulatory Visit: Payer: Self-pay

## 2020-07-24 DIAGNOSIS — E349 Endocrine disorder, unspecified: Secondary | ICD-10-CM

## 2020-07-25 LAB — HEMOGLOBIN AND HEMATOCRIT, BLOOD
Hematocrit: 46.5 % (ref 37.5–51.0)
Hemoglobin: 15.7 g/dL (ref 13.0–17.7)

## 2020-07-25 LAB — TESTOSTERONE: Testosterone: 750 ng/dL (ref 264–916)

## 2020-07-30 NOTE — Progress Notes (Signed)
02/02/2019 9:14 PM   Robert Trevino 03-26-1971 [id number]  Referring provider: Carlean Jews, NP [id number] [place],  [place] [address]   Chief Complaint  Patient presents with  . Benign Prostatic Hypertrophy  . Hypogonadism   HPI: Robert Trevino is a 49 y.o. male with testosterone deficiency, erectile dysfunction and BPH with LU TS who presents today for a follow up.    Testosterone deficiency He is no longer having spontaneous erections at night.  He has sleep apnea and is sleeping with a CPAP machine when he can.  He is currently managing his testosterone deficiency with testosterone cypionate 200 mg/mL, 0.75 cc every two weeks.   Component     Latest Ref Rng & Units 07/24/2020  Testosterone     264 - 916 ng/dL 751   Component     Latest Ref Rng & Units 07/24/2020  Hemoglobin     13.0 - 17.7 g/dL 70.0  HCT     17.4 - 94.4 % 46.5    Erectile dysfunction His SHIM score is 16, which is mild to moderate ED.  His previous SHIM score was 17.  He has been having difficulty with erections for the last few years.   His major complaint is getting an erection.  His libido is preserved.  His risk factors for ED are age, BPH, testosterone deficiency, and sleep apnea (for which he sleeps with a CPAP machine).  He denies any painful erections or curvatures with his erections.  He is no longer having spontaneous erections.  He is finding the Cialis 5 mg or 10 mg prn helpful with erections.     SHIM    Row Name 07/31/20 1535         SHIM: Over the last 6 months:   How do you rate your confidence that you could get and keep an erection? Low     When you had erections with sexual stimulation, how often were your erections hard enough for penetration (entering your partner)? Sometimes (about half the time)     During sexual intercourse, how often were you able to maintain your erection after you had penetrated (entered) your partner? Sometimes (about half the time)     During sexual  intercourse, how difficult was it to maintain your erection to completion of intercourse? Slightly Difficult     When you attempted sexual intercourse, how often was it satisfactory for you? Most Times (much more than half the time)       SHIM Total Score   SHIM 16           Score: 1-7 Severe ED 8-11 Moderate ED 12-16 Mild-Moderate ED 17-21 Mild ED 22-25 No ED  BPH WITH LUTS  (prostate and/or bladder) IPSS score: 5/1   Previous score: 2/0  No complaints  Denies any dysuria, hematuria or suprapubic pain.  Denies any recent fevers, chills, nausea or vomiting.   He does not have a family history of PCa.   IPSS    Row Name 07/31/20 1500         International Prostate Symptom Score   How often have you had the sensation of not emptying your bladder? Not at All     How often have you had to urinate less than every two hours? Less than 1 in 5 times     How often have you found you stopped and started again several times when you urinated? Less than 1 in 5 times  How often have you found it difficult to postpone urination? Less than 1 in 5 times     How often have you had a weak urinary stream? Not at All     How often have you had to strain to start urination? Less than 1 in 5 times     How many times did you typically get up at night to urinate? 1 Time     Total IPSS Score 5       Quality of Life due to urinary symptoms   If you were to spend the rest of your life with your urinary condition just the way it is now how would you feel about that? Pleased           Score:  1-7 Mild 8-19 Moderate 20-35 Severe  PMH: Past Medical History:  Diagnosis Date  . Low testosterone   . Sleep apnea     Surgical History: Past Surgical History:  Procedure Laterality Date  . NO PAST SURGERIES     verified with patient 10/22/17    Home Medications:  Allergies as of 07/31/2020   No Known Allergies     Medication List       Accurate as of July 31, 2020 11:59 PM. If  you have any questions, ask your nurse or doctor.        fluticasone 50 MCG/ACT nasal spray Commonly known as: FLONASE Place 2 sprays into both nostrils as needed.   rosuvastatin 20 MG tablet Commonly known as: CRESTOR   tadalafil 5 MG tablet Commonly known as: CIALIS TAKE ONE TABLET BY MOUTH DAILY AS NEEDED FOR ERECTILE DYSFUNCTION   testosterone cypionate 200 MG/ML injection Commonly known as: DEPOTESTOSTERONE CYPIONATE INJECT 0.75MLS EVERY 2 WEEKS   triamcinolone cream 0.1 % Commonly known as: KENALOG Apply 1 application topically 2 (two) times daily.       Allergies: No Known Allergies  Family History: Family History  Problem Relation Age of Onset  . Stroke Father   . Diabetes Mother   . Healthy Daughter   . Kidney disease Neg Hx   . Prostate cancer Neg Hx   . Kidney cancer Neg Hx   . Bladder Cancer Neg Hx     Social History:  reports that he has never smoked. He has never used smokeless tobacco. He reports current alcohol use. He reports that he does not use drugs.  ROS: For pertinent review of systems please refer to history of present illness  Physical Exam: BP 118/79   Pulse 77   Ht 6\' 5"  (1.956 m)   Wt 290 lb (131.5 kg)   BMI 34.39 kg/m   Constitutional:  Well nourished. Alert and oriented, No acute distress. HEENT: [place] AT, mask in place.  Trachea midline Cardiovascular: No clubbing, cyanosis, or edema. Respiratory: Normal respiratory effort, no increased work of breathing. GU: No CVA tenderness.  No bladder fullness or masses.  Patient with circumcised phallus. Urethral meatus is patent.  No penile discharge. No penile lesions or rashes. Scrotum without lesions, cysts, rashes and/or edema.  Testicles are located scrotally bilaterally. No masses are appreciated in the testicles. Left and right epididymis are normal. Rectal: Patient with  normal sphincter tone. Anus and perineum without scarring or rashes. No rectal masses are appreciated. Prostate is  approximately 50 grams, could only palpate the apex and the midportion of the gland, no nodules are appreciated. Seminal vesicles could not be palpated Skin: No rashes, bruises or suspicious lesions. Lymph: No inguinal  adenopathy. Neurologic: Grossly intact, no focal deficits, moving all 4 extremities. Psychiatric: Normal mood and affect.  Laboratory Data: Urinalysis Component     Latest Ref Rng & Units 07/31/2020  Specific Gravity, UA     1.005 - 1.030 >1.030 (H)  pH, UA     5.0 - 7.5 5.5  Color, UA     Yellow Yellow  Appearance Ur     Clear Clear  Leukocytes,UA     Negative Negative  Protein,UA     Negative/Trace Negative  Glucose, UA     Negative Negative  Ketones, UA     Negative Negative  RBC, UA     Negative Negative  Bilirubin, UA     Negative Negative  Urobilinogen, Ur     0.2 - 1.0 mg/dL 0.2  Nitrite, UA     Negative Negative  Microscopic Examination      See below:   Component     Latest Ref Rng & Units 07/31/2020  WBC, UA     0 - 5 /hpf 0-5  RBC     0 - 2 /hpf 0-2  Epithelial Cells (non renal)     0 - 10 /hpf 0-10  Bacteria, UA     None seen/Few None seen   Lab Results  Component Value Date   WBC 7.8 05/30/2019   HGB 15.7 07/24/2020   HCT 46.5 07/24/2020   MCV 90 05/30/2019   PLT 302 05/30/2019    Component     Latest Ref Rng & Units 05/01/2016 12/16/2016 04/22/2017 10/20/2017  Prostate Specific Ag, Serum     0.0 - 4.0 ng/mL 0.7 0.6 0.7 0.7   Component     Latest Ref Rng & Units 04/14/2018 11/03/2018 02/02/2019 05/30/2019  Prostate Specific Ag, Serum     0.0 - 4.0 ng/mL 0.7 0.6 0.7 0.6   Component     Latest Ref Rng & Units 02/01/2020  Prostate Specific Ag, Serum     0.0 - 4.0 ng/mL 0.6   Lab Results  Component Value Date   TESTOSTERONE 750 07/24/2020   I have reviewed the labs.  Assessment & Plan:    1. Testosterone deficiency  Testosterone is at therapeutic levels Continue testosterone cypionate 200 mg/mL , 0.75 cc IM every two weeks   RTC in 6 months for HCT/HBG and testosterone (one week after an injection)  2. BPH with LUTS - IPSS score is 5/1, it has worsened - Continue conservative management, avoiding bladder irritants and timed voiding's - RTC in 6 months for IPSS, PSA and exam, as testosterone therapy can cause prostate enlargement and worsen LUTS  3. Erectile dysfunction:    SHIM score is 16, it has worsened Continue the Cialis 5 mg daily  RTC in 6 months for SHIM score and exam, as testosterone therapy can affect erections  Return in about 6 months (around 01/28/2021) for PSA, testosterone (one week after injection) H & H , IPSS, SHIM and exam.  Michiel Cowboy, PA-C  [facility] [address] [place], [place] [address] [phone]

## 2020-07-31 ENCOUNTER — Encounter: Payer: Self-pay | Admitting: Urology

## 2020-07-31 ENCOUNTER — Ambulatory Visit (INDEPENDENT_AMBULATORY_CARE_PROVIDER_SITE_OTHER): Payer: BC Managed Care – PPO | Admitting: Urology

## 2020-07-31 ENCOUNTER — Other Ambulatory Visit: Payer: Self-pay

## 2020-07-31 VITALS — BP 118/79 | HR 77 | Ht 77.0 in | Wt 290.0 lb

## 2020-07-31 DIAGNOSIS — N138 Other obstructive and reflux uropathy: Secondary | ICD-10-CM | POA: Diagnosis not present

## 2020-07-31 DIAGNOSIS — N401 Enlarged prostate with lower urinary tract symptoms: Secondary | ICD-10-CM

## 2020-07-31 DIAGNOSIS — N529 Male erectile dysfunction, unspecified: Secondary | ICD-10-CM | POA: Diagnosis not present

## 2020-07-31 DIAGNOSIS — E349 Endocrine disorder, unspecified: Secondary | ICD-10-CM

## 2020-07-31 MED ORDER — TESTOSTERONE CYPIONATE 200 MG/ML IM SOLN: INTRAMUSCULAR | 0 refills | Status: DC

## 2020-08-01 LAB — URINALYSIS, COMPLETE
Bilirubin, UA: NEGATIVE
Glucose, UA: NEGATIVE
Ketones, UA: NEGATIVE
Leukocytes,UA: NEGATIVE
Nitrite, UA: NEGATIVE
Protein,UA: NEGATIVE
RBC, UA: NEGATIVE
Specific Gravity, UA: >1.03 — ABNORMAL HIGH (ref 1.005–1.030)
Urobilinogen, Ur: 0.2 mg/dL (ref 0.2–1.0)
pH, UA: 5.5 (ref 5.0–7.5)

## 2020-08-01 LAB — MICROSCOPIC EXAMINATION: Bacteria, UA: NONE SEEN

## 2020-08-07 ENCOUNTER — Ambulatory Visit (INDEPENDENT_AMBULATORY_CARE_PROVIDER_SITE_OTHER): Payer: BC Managed Care – PPO | Admitting: Vascular Surgery

## 2020-08-21 ENCOUNTER — Other Ambulatory Visit: Payer: Self-pay

## 2020-08-21 ENCOUNTER — Encounter (INDEPENDENT_AMBULATORY_CARE_PROVIDER_SITE_OTHER): Payer: Self-pay | Admitting: Vascular Surgery

## 2020-08-21 ENCOUNTER — Ambulatory Visit (INDEPENDENT_AMBULATORY_CARE_PROVIDER_SITE_OTHER): Payer: BC Managed Care – PPO | Admitting: Vascular Surgery

## 2020-08-21 VITALS — BP 144/84 | HR 67 | Resp 16 | Wt 291.6 lb

## 2020-08-21 DIAGNOSIS — I8311 Varicose veins of right lower extremity with inflammation: Secondary | ICD-10-CM

## 2020-08-21 DIAGNOSIS — I8312 Varicose veins of left lower extremity with inflammation: Secondary | ICD-10-CM

## 2020-08-21 NOTE — Progress Notes (Signed)
  Tomas B Ellerby is a 49 y.o.male who presents with painful varicose veins of the left leg ° °Past Medical History:  °Diagnosis Date  °• Low testosterone   °• Sleep apnea   ° ° °Past Surgical History:  °Procedure Laterality Date  °• NO PAST SURGERIES    ° verified with patient 10/22/17  ° ° °Current Outpatient Medications  °Medication Sig Dispense Refill  °• fluticasone (FLONASE) 50 MCG/ACT nasal spray Place 2 sprays into both nostrils as needed.     °• rosuvastatin (CRESTOR) 20 MG tablet   0  °• tadalafil (CIALIS) 5 MG tablet TAKE ONE TABLET BY MOUTH DAILY AS NEEDED FOR ERECTILE DYSFUNCTION 90 tablet 2  °• testosterone cypionate (DEPOTESTOSTERONE CYPIONATE) 200 MG/ML injection INJECT 0.75MLS EVERY 2 WEEKS 10 mL 0  °• triamcinolone cream (KENALOG) 0.1 % Apply 1 application topically 2 (two) times daily. 45 g 2  ° °No current facility-administered medications for this visit.  ° ° °No Known Allergies ° °Indication: Patient presents with symptomatic varicose veins of the left lower extremity. ° °Procedure: Foam sclerotherapy was performed on the left lower extremity. Using ultrasound guidance, 5 mL of foam Sotradecol was used to inject the varicosities of the left lower extremity. Compression wraps were placed. The patient tolerated the procedure well. °

## 2020-08-28 ENCOUNTER — Ambulatory Visit (INDEPENDENT_AMBULATORY_CARE_PROVIDER_SITE_OTHER): Payer: BC Managed Care – PPO | Admitting: Vascular Surgery

## 2020-09-18 ENCOUNTER — Ambulatory Visit (INDEPENDENT_AMBULATORY_CARE_PROVIDER_SITE_OTHER): Payer: BC Managed Care – PPO | Admitting: Vascular Surgery

## 2020-09-18 ENCOUNTER — Encounter (INDEPENDENT_AMBULATORY_CARE_PROVIDER_SITE_OTHER): Payer: Self-pay | Admitting: Vascular Surgery

## 2020-09-18 ENCOUNTER — Other Ambulatory Visit: Payer: Self-pay

## 2020-09-18 VITALS — BP 119/79 | HR 60 | Ht 77.0 in | Wt 289.0 lb

## 2020-09-18 DIAGNOSIS — I8312 Varicose veins of left lower extremity with inflammation: Secondary | ICD-10-CM | POA: Diagnosis not present

## 2020-09-18 DIAGNOSIS — I8311 Varicose veins of right lower extremity with inflammation: Secondary | ICD-10-CM | POA: Diagnosis not present

## 2020-09-18 NOTE — Progress Notes (Signed)
°  Robert Trevino is a 49 y.o.male who presents with painful varicose veins of the left leg  Past Medical History:  Diagnosis Date   Low testosterone    Sleep apnea     Past Surgical History:  Procedure Laterality Date   NO PAST SURGERIES     verified with patient 10/22/17    Current Outpatient Medications  Medication Sig Dispense Refill   fluticasone (FLONASE) 50 MCG/ACT nasal spray Place 2 sprays into both nostrils as needed.      rosuvastatin (CRESTOR) 20 MG tablet   0   tadalafil (CIALIS) 5 MG tablet TAKE ONE TABLET BY MOUTH DAILY AS NEEDED FOR ERECTILE DYSFUNCTION 90 tablet 2   testosterone cypionate (DEPOTESTOSTERONE CYPIONATE) 200 MG/ML injection INJECT 0.75MLS EVERY 2 WEEKS 10 mL 0   triamcinolone cream (KENALOG) 0.1 % Apply 1 application topically 2 (two) times daily. 45 g 2   No current facility-administered medications for this visit.    No Known Allergies  Indication: Patient presents with symptomatic varicose veins of the left lower extremity.  Procedure: Foam sclerotherapy was performed on the left lower extremity. Using ultrasound guidance, 5 mL of foam Sotradecol was used to inject the varicosities of the left lower extremity. Compression wraps were placed. The patient tolerated the procedure well.

## 2020-10-09 ENCOUNTER — Other Ambulatory Visit: Payer: Self-pay

## 2020-10-09 ENCOUNTER — Encounter (INDEPENDENT_AMBULATORY_CARE_PROVIDER_SITE_OTHER): Payer: Self-pay | Admitting: Vascular Surgery

## 2020-10-09 ENCOUNTER — Ambulatory Visit (INDEPENDENT_AMBULATORY_CARE_PROVIDER_SITE_OTHER): Payer: BC Managed Care – PPO | Admitting: Vascular Surgery

## 2020-10-09 VITALS — BP 118/76 | HR 68 | Ht 77.0 in | Wt 293.0 lb

## 2020-10-09 DIAGNOSIS — I8311 Varicose veins of right lower extremity with inflammation: Secondary | ICD-10-CM | POA: Diagnosis not present

## 2020-10-09 DIAGNOSIS — I8312 Varicose veins of left lower extremity with inflammation: Secondary | ICD-10-CM | POA: Diagnosis not present

## 2020-10-09 NOTE — Progress Notes (Signed)
  Robert Trevino is a 49 y.o.male who presents with painful varicose veins of the left leg ° °Past Medical History:  °Diagnosis Date  °• Low testosterone   °• Sleep apnea   ° ° °Past Surgical History:  °Procedure Laterality Date  °• NO PAST SURGERIES    ° verified with patient 10/22/17  ° ° °Current Outpatient Medications  °Medication Sig Dispense Refill  °• fluticasone (FLONASE) 50 MCG/ACT nasal spray Place 2 sprays into both nostrils as needed.     °• rosuvastatin (CRESTOR) 20 MG tablet   0  °• tadalafil (CIALIS) 5 MG tablet TAKE ONE TABLET BY MOUTH DAILY AS NEEDED FOR ERECTILE DYSFUNCTION 90 tablet 2  °• testosterone cypionate (DEPOTESTOSTERONE CYPIONATE) 200 MG/ML injection INJECT 0.75MLS EVERY 2 WEEKS 10 mL 0  °• triamcinolone cream (KENALOG) 0.1 % Apply 1 application topically 2 (two) times daily. 45 g 2  ° °No current facility-administered medications for this visit.  ° ° °No Known Allergies ° °Indication: Patient presents with symptomatic varicose veins of the left lower extremity. ° °Procedure: Foam sclerotherapy was performed on the left lower extremity. Using ultrasound guidance, 5 mL of foam Sotradecol was used to inject the varicosities of the left lower extremity. Compression wraps were placed. The patient tolerated the procedure well. °

## 2020-10-16 ENCOUNTER — Other Ambulatory Visit: Payer: Self-pay

## 2020-10-16 ENCOUNTER — Ambulatory Visit (INDEPENDENT_AMBULATORY_CARE_PROVIDER_SITE_OTHER): Payer: BC Managed Care – PPO | Admitting: Nurse Practitioner

## 2020-10-16 ENCOUNTER — Encounter: Payer: Self-pay | Admitting: Nurse Practitioner

## 2020-10-16 VITALS — BP 132/80 | HR 85 | Temp 98.2°F | Resp 16 | Ht 77.0 in | Wt 296.8 lb

## 2020-10-16 DIAGNOSIS — E782 Mixed hyperlipidemia: Secondary | ICD-10-CM | POA: Diagnosis not present

## 2020-10-16 DIAGNOSIS — E291 Testicular hypofunction: Secondary | ICD-10-CM | POA: Diagnosis not present

## 2020-10-16 DIAGNOSIS — M79661 Pain in right lower leg: Secondary | ICD-10-CM

## 2020-10-16 DIAGNOSIS — Z0001 Encounter for general adult medical examination with abnormal findings: Secondary | ICD-10-CM | POA: Diagnosis not present

## 2020-10-16 DIAGNOSIS — M25561 Pain in right knee: Secondary | ICD-10-CM | POA: Diagnosis not present

## 2020-10-16 DIAGNOSIS — N529 Male erectile dysfunction, unspecified: Secondary | ICD-10-CM

## 2020-10-16 NOTE — Progress Notes (Signed)
[facility] [place], [place] [address]  Internal MEDICINE  Office Visit Note  Patient Name: Robert Trevino  [id number]  [id number]  Date of Service: 11/18/2020   Pt is here for routine health maintenance examination  Chief Complaint  Patient presents with  . Annual Exam  . Quality Metric Gaps    flu,covid,hep C  . controlled substance form    reviewed with PT     The patient is here for health maintenance exam. He is having right knee pain which radiates down the leg.  -blood pressure well controlled.  -due to have routine, fasting labs. Need to check testosterone levels along with labs.     Current Medication: Outpatient Encounter Medications as of 10/16/2020  Medication Sig  . fluticasone (FLONASE) 50 MCG/ACT nasal spray Place 2 sprays into both nostrils as needed.   . rosuvastatin (CRESTOR) 20 MG tablet   . tadalafil (CIALIS) 5 MG tablet TAKE ONE TABLET BY MOUTH DAILY AS NEEDED FOR ERECTILE DYSFUNCTION  . testosterone cypionate (DEPOTESTOSTERONE CYPIONATE) 200 MG/ML injection INJECT 0.75MLS EVERY 2 WEEKS  . triamcinolone cream (KENALOG) 0.1 % Apply 1 application topically 2 (two) times daily.   No facility-administered encounter medications on file as of 10/16/2020.    Surgical History: Past Surgical History:  Procedure Laterality Date  . NO PAST SURGERIES     verified with patient 10/22/17    Medical History: Past Medical History:  Diagnosis Date  . Low testosterone   . Sleep apnea     Family History: Family History  Problem Relation Age of Onset  . Stroke Father   . Diabetes Mother   . Healthy Daughter   . Kidney disease Neg Hx   . Prostate cancer Neg Hx   . Kidney cancer Neg Hx   . Bladder Cancer Neg Hx       Review of Systems  Constitutional: Negative for activity change, chills, fatigue and unexpected weight change.  HENT: Negative for congestion, postnasal drip, rhinorrhea, sneezing and sore throat.   Respiratory:  Negative for cough, chest tightness, shortness of breath and wheezing.   Cardiovascular: Negative for chest pain and palpitations.  Gastrointestinal: Negative for abdominal pain, constipation, diarrhea, nausea and vomiting.  Endocrine: Negative for cold intolerance, heat intolerance, polydipsia and polyuria.  Genitourinary: Negative for dysuria, frequency, hematuria and urgency.       Patient due to have testosterone levels checked.   Musculoskeletal: Positive for arthralgias and joint swelling. Negative for back pain and neck pain.       Right kee pain which radiates into the right lower leg. More painful when he applies weight to his leg, or if he is standing or walking for long period of time.   Skin: Negative for rash.  Neurological: Negative.  Negative for tremors and numbness.  Hematological: Negative for adenopathy. Does not bruise/bleed easily.  Psychiatric/Behavioral: Negative for behavioral problems (Depression), sleep disturbance and suicidal ideas. The patient is not nervous/anxious.     Today's Vitals   10/16/20 1534  BP: 132/80  Pulse: 85  Resp: 16  Temp: 98.2 F (36.8 C)  SpO2: 97%  Weight: 296 lb 12.8 oz (134.6 kg)  Height: '6\' 5"'  (1.956 m)   Body mass index is 35.2 kg/m.   Physical Exam Vitals and nursing note reviewed.  Constitutional:      General: He is not in acute distress.    Appearance: Normal appearance. He is well-developed and well-nourished. He is not diaphoretic.  HENT:  Head: Normocephalic and atraumatic.     Nose: Nose normal.     Mouth/Throat:     Mouth: Oropharynx is clear and moist.     Pharynx: No oropharyngeal exudate.  Eyes:     Extraocular Movements: EOM normal.     Pupils: Pupils are equal, round, and reactive to light.  Neck:     Thyroid: No thyromegaly.     Vascular: No carotid bruit or JVD.     Trachea: No tracheal deviation.  Cardiovascular:     Rate and Rhythm: Normal rate and regular rhythm.     Pulses: Normal pulses.      Heart sounds: Normal heart sounds. No murmur heard. No friction rub. No gallop.   Pulmonary:     Effort: Pulmonary effort is normal. No respiratory distress.     Breath sounds: Normal breath sounds. No wheezing or rales.  Chest:     Chest wall: No tenderness.  Abdominal:     General: Bowel sounds are normal.     Palpations: Abdomen is soft.     Tenderness: There is no abdominal tenderness.  Musculoskeletal:        General: Normal range of motion.     Cervical back: Normal range of motion and neck supple.     Comments: Right knee tenderness. Swelling is present. Crepitus can be felt with flexion and extension of the right knee. Patient walking with mild limp, favoring the right side.   Lymphadenopathy:     Cervical: No cervical adenopathy.  Skin:    General: Skin is warm and dry.  Neurological:     General: No focal deficit present.     Mental Status: He is alert and oriented to person, place, and time.     Cranial Nerves: No cranial nerve deficit.  Psychiatric:        Mood and Affect: Mood and affect and mood normal.        Behavior: Behavior normal.        Thought Content: Thought content normal.        Judgment: Judgment normal.      LABS: Recent Results (from the past 2160 hour(s))  Comprehensive metabolic panel     Status: Abnormal   Collection Time: 11/02/20  8:24 AM  Result Value Ref Range   Glucose 106 (H) 65 - 99 mg/dL   BUN 17 6 - 24 mg/dL   Creatinine, Ser 1.10 0.76 - 1.27 mg/dL   GFR calc non Af Amer 80 >59 mL/min/1.73   GFR calc Af Amer 92 >59 mL/min/1.73    Comment: **In accordance with recommendations from the NKF-ASN Task force,**   [facility] is in the process of updating its eGFR calculation to the   2021 CKD-EPI creatinine equation that estimates kidney function   without a race variable.    BUN/Creatinine Ratio 15 9 - 20   Sodium 140 134 - 144 mmol/L   Potassium 5.2 3.5 - 5.2 mmol/L   Chloride 102 96 - 106 mmol/L   CO2 26 20 - 29 mmol/L   Calcium  9.2 8.7 - 10.2 mg/dL   Total Protein 7.0 6.0 - 8.5 g/dL   Albumin 4.5 4.0 - 5.0 g/dL   Globulin, Total 2.5 1.5 - 4.5 g/dL   Albumin/Globulin Ratio 1.8 1.2 - 2.2   Bilirubin Total 0.5 0.0 - 1.2 mg/dL   Alkaline Phosphatase 109 44 - 121 IU/L    Comment:               **  Please note reference interval change**   AST 19 0 - 40 IU/L   ALT 23 0 - 44 IU/L  CBC     Status: None   Collection Time: 11/02/20  8:24 AM  Result Value Ref Range   WBC 7.9 3.4 - 10.8 x10E3/uL   RBC 5.00 4.14 - 5.80 x10E6/uL   Hemoglobin 15.5 13.0 - 17.7 g/dL   Hematocrit 45.4 37.5 - 51.0 %   MCV 91 79 - 97 fL   MCH 31.0 26.6 - 33.0 pg   MCHC 34.1 31.5 - 35.7 g/dL   RDW 12.3 11.6 - 15.4 %   Platelets 326 150 - 450 x10E3/uL  Lipid Panel With LDL/HDL Ratio     Status: Abnormal   Collection Time: 11/02/20  8:24 AM  Result Value Ref Range   Cholesterol, Total 167 100 - 199 mg/dL   Triglycerides 94 0 - 149 mg/dL   HDL 41 >39 mg/dL   VLDL Cholesterol Cal 17 5 - 40 mg/dL   LDL Chol Calc (NIH) 109 (H) 0 - 99 mg/dL   LDL/HDL Ratio 2.7 0.0 - 3.6 ratio    Comment:                                     LDL/HDL Ratio                                             Men  Women                               1/2 Avg.Risk  1.0    1.5                                   Avg.Risk  3.6    3.2                                2X Avg.Risk  6.2    5.0                                3X Avg.Risk  8.0    6.1   Hgb A1c w/o eAG     Status: Abnormal   Collection Time: 11/02/20  8:24 AM  Result Value Ref Range   Hgb A1c MFr Bld 5.7 (H) 4.8 - 5.6 %    Comment:          Prediabetes: 5.7 - 6.4          Diabetes: >6.4          Glycemic control for adults with diabetes: <7.0   T4, free     Status: None   Collection Time: 11/02/20  8:24 AM  Result Value Ref Range   Free T4 1.24 0.82 - 1.77 ng/dL  TSH     Status: None   Collection Time: 11/02/20  8:24 AM  Result Value Ref Range   TSH 2.220 0.450 - 4.500 uIU/mL    Assessment/Plan: 1. Encounter  for general adult medical examination with abnormal findings Annual health maintenance exam  today. Order slip given to have routine, fasting labs drawn.   2. Mixed hyperlipidemia Check lipid panel and adjust dosing of crestor as indicated.   3. Pain of knee and lower leg, right X-ray the right knee and right tib/fib for further evaluation. Refer to orthopedics as indicated.  - DG Knee 1-2 Views Right; Future - DG Tibia/Fibula Right; Future  4. Hypogonadism in male Check testosterone levels and adjust dosing as indicated.   5. Erectile dysfunction of organic origin May take cialis as prescribed.   General Counseling: Dre verbalizes understanding of the findings of todays visit and agrees with plan of treatment. I have discussed any further diagnostic evaluation that may be needed or ordered today. We also reviewed his medications today. he has been encouraged to call the office with any questions or concerns that should arise related to todays visit.    Counseling:  This patient was seen by Leretha Pol FNP Collaboration with Dr Lavera Guise as a part of collaborative care agreement  Orders Placed This Encounter  Procedures  . DG Knee 1-2 Views Right  . DG Tibia/Fibula Right     Total time spent: 30 Minutes  Time spent includes review of chart, medications, test results, and follow up plan with the patient.     Lavera Guise, MD  Internal Medicine

## 2020-11-02 ENCOUNTER — Other Ambulatory Visit: Payer: Self-pay | Admitting: Nurse Practitioner

## 2020-11-03 LAB — COMPREHENSIVE METABOLIC PANEL
ALT: 23 IU/L (ref 0–44)
AST: 19 IU/L (ref 0–40)
Albumin/Globulin Ratio: 1.8 (ref 1.2–2.2)
Albumin: 4.5 g/dL (ref 4.0–5.0)
Alkaline Phosphatase: 109 IU/L (ref 44–121)
BUN/Creatinine Ratio: 15 (ref 9–20)
BUN: 17 mg/dL (ref 6–24)
Bilirubin Total: 0.5 mg/dL (ref 0.0–1.2)
CO2: 26 mmol/L (ref 20–29)
Calcium: 9.2 mg/dL (ref 8.7–10.2)
Chloride: 102 mmol/L (ref 96–106)
Creatinine, Ser: 1.1 mg/dL (ref 0.76–1.27)
GFR calc Af Amer: 92 mL/min/{1.73_m2} (ref >59)
GFR calc non Af Amer: 80 mL/min/{1.73_m2} (ref >59)
Globulin, Total: 2.5 g/dL (ref 1.5–4.5)
Glucose: 106 mg/dL — ABNORMAL HIGH (ref 65–99)
Potassium: 5.2 mmol/L (ref 3.5–5.2)
Sodium: 140 mmol/L (ref 134–144)
Total Protein: 7 g/dL (ref 6.0–8.5)

## 2020-11-03 LAB — CBC
Hematocrit: 45.4 % (ref 37.5–51.0)
Hemoglobin: 15.5 g/dL (ref 13.0–17.7)
MCH: 31 pg (ref 26.6–33.0)
MCHC: 34.1 g/dL (ref 31.5–35.7)
MCV: 91 fL (ref 79–97)
Platelets: 326 10*3/uL (ref 150–450)
RBC: 5 x10E6/uL (ref 4.14–5.80)
RDW: 12.3 % (ref 11.6–15.4)
WBC: 7.9 10*3/uL (ref 3.4–10.8)

## 2020-11-03 LAB — LIPID PANEL WITH LDL/HDL RATIO
Cholesterol, Total: 167 mg/dL (ref 100–199)
HDL: 41 mg/dL (ref >39)
LDL Chol Calc (NIH): 109 mg/dL — ABNORMAL HIGH (ref 0–99)
LDL/HDL Ratio: 2.7 ratio (ref 0.0–3.6)
Triglycerides: 94 mg/dL (ref 0–149)
VLDL Cholesterol Cal: 17 mg/dL (ref 5–40)

## 2020-11-03 LAB — TSH: TSH: 2.22 u[IU]/mL (ref 0.450–4.500)

## 2020-11-03 LAB — T4, FREE: Free T4: 1.24 ng/dL (ref 0.82–1.77)

## 2020-11-03 LAB — HGB A1C W/O EAG: Hgb A1c MFr Bld: 5.7 % — ABNORMAL HIGH (ref 4.8–5.6)

## 2020-11-14 NOTE — Progress Notes (Signed)
Labs good overall. Discuss at next visit.

## 2020-11-18 DIAGNOSIS — M79661 Pain in right lower leg: Secondary | ICD-10-CM | POA: Insufficient documentation

## 2020-11-18 DIAGNOSIS — Z0000 Encounter for general adult medical examination without abnormal findings: Secondary | ICD-10-CM | POA: Insufficient documentation

## 2020-11-18 DIAGNOSIS — M25561 Pain in right knee: Secondary | ICD-10-CM | POA: Insufficient documentation

## 2020-11-18 DIAGNOSIS — Z0001 Encounter for general adult medical examination with abnormal findings: Secondary | ICD-10-CM | POA: Insufficient documentation

## 2021-01-29 ENCOUNTER — Ambulatory Visit: Payer: Self-pay | Admitting: Urology

## 2021-01-30 ENCOUNTER — Other Ambulatory Visit: Payer: BC Managed Care – PPO

## 2021-01-30 ENCOUNTER — Other Ambulatory Visit: Payer: Self-pay | Admitting: Family Medicine

## 2021-01-30 ENCOUNTER — Other Ambulatory Visit: Payer: Self-pay

## 2021-01-30 DIAGNOSIS — N138 Other obstructive and reflux uropathy: Secondary | ICD-10-CM

## 2021-01-30 DIAGNOSIS — E349 Endocrine disorder, unspecified: Secondary | ICD-10-CM

## 2021-01-31 LAB — HEMOGLOBIN: Hemoglobin: 16.1 g/dL (ref 13.0–17.7)

## 2021-01-31 LAB — PSA: Prostate Specific Ag, Serum: 0.8 ng/mL (ref 0.0–4.0)

## 2021-01-31 LAB — HEMATOCRIT: Hematocrit: 46.7 % (ref 37.5–51.0)

## 2021-01-31 LAB — TESTOSTERONE: Testosterone: 736 ng/dL (ref 264–916)

## 2021-02-04 NOTE — Progress Notes (Signed)
02/02/2019 4:06 PM   Robert Trevino Dec 12, 1970 [id number]  Referring provider: Carlean Jews, NP [address] [place],  [place] [address]   Chief Complaint  Patient presents with  . Hypogonadism   Urological history: 1.  Testosterone deficiency -Managed with testosterone cypionate 200 mg/mL, 0.75 cc every 2 weeks  2. ED - SHIM 17 -Contributing factors of age, BPH, testosterone deficiency, and sleep apnea (for which he sleeps with a CPAP machine) -Managed with tadalafil 10 mg, on-demand dosing  3. BPH with LU TS -PSA 0.8 in 01/2021 -I PSS 6/1  4.  Sleep apnea -Sleeping with CPAP machine on most nights  HPI: Robert Trevino is a 50 y.o. male who presents today for a follow up.    He finds the tadalafil helpful.   Patient still having spontaneous erections.  He denies any pain or curvature with erections.     SHIM    Row Name 02/05/21 1528         SHIM: Over the last 6 months:   How do you rate your confidence that you could get and keep an erection? Moderate     When you had erections with sexual stimulation, how often were your erections hard enough for penetration (entering your partner)? Sometimes (about half the time)     During sexual intercourse, how often were you able to maintain your erection after you had penetrated (entered) your partner? Sometimes (about half the time)     During sexual intercourse, how difficult was it to maintain your erection to completion of intercourse? Slightly Difficult     When you attempted sexual intercourse, how often was it satisfactory for you? Most Times (much more than half the time)           SHIM Total Score   SHIM 17           Score: 1-7 Severe ED 8-11 Moderate ED 12-16 Mild-Moderate ED 17-21 Mild ED 22-25 No ED  He has no urinary complaints at this time.   Patient denies any modifying or aggravating factors.  Patient denies any gross hematuria, dysuria or suprapubic/flank pain.  Patient denies  any fevers, chills, nausea or vomiting.    IPSS    Row Name 02/05/21 1500         International Prostate Symptom Score   How often have you had the sensation of not emptying your bladder? Not at All     How often have you had to urinate less than every two hours? Less than 1 in 5 times     How often have you found you stopped and started again several times when you urinated? Less than 1 in 5 times     How often have you found it difficult to postpone urination? Less than 1 in 5 times     How often have you had a weak urinary stream? Less than 1 in 5 times     How often have you had to strain to start urination? Less than 1 in 5 times     How many times did you typically get up at night to urinate? 1 Time     Total IPSS Score 6           Quality of Life due to urinary symptoms   If you were to spend the rest of your life with your urinary condition just the way it is now how would you feel about that? Pleased  Score:  1-7 Mild 8-19 Moderate 20-35 Severe  PMH: Past Medical History:  Diagnosis Date  . Low testosterone   . Sleep apnea     Surgical History: Past Surgical History:  Procedure Laterality Date  . NO PAST SURGERIES     verified with patient 10/22/17    Home Medications:  Allergies as of 02/05/2021   No Known Allergies     Medication List       Accurate as of February 05, 2021  4:06 PM. If you have any questions, ask your nurse or doctor.        fluticasone 50 MCG/ACT nasal spray Commonly known as: FLONASE Place 2 sprays into both nostrils as needed.   rosuvastatin 20 MG tablet Commonly known as: CRESTOR   tadalafil 5 MG tablet Commonly known as: CIALIS TAKE ONE TABLET BY MOUTH DAILY AS NEEDED FOR ERECTILE DYSFUNCTION   testosterone cypionate 200 MG/ML injection Commonly known as: DEPOTESTOSTERONE CYPIONATE INJECT 0.75MLS EVERY 2 WEEKS   triamcinolone 0.1 % Commonly known as: KENALOG Apply 1 application topically 2 (two) times  daily.       Allergies: No Known Allergies  Family History: Family History  Problem Relation Age of Onset  . Stroke Father   . Diabetes Mother   . Healthy Daughter   . Kidney disease Neg Hx   . Prostate cancer Neg Hx   . Kidney cancer Neg Hx   . Bladder Cancer Neg Hx     Social History:  reports that he has never smoked. He has never used smokeless tobacco. He reports current alcohol use. He reports that he does not use drugs.  ROS: For pertinent review of systems please refer to history of present illness  Physical Exam: BP (!) 141/90   Pulse 86   Ht 6\' 5"  (1.956 m)   Wt 290 lb (131.5 kg)   BMI 34.39 kg/m   Constitutional:  Well nourished. Alert and oriented, No acute distress. HEENT: [place] AT, mask in place.  Trachea midline Cardiovascular: No clubbing, cyanosis, or edema. Respiratory: Normal respiratory effort, no increased work of breathing. GU: No CVA tenderness.  No bladder fullness or masses.  Patient with circumcised phallus.  Urethral meatus is patent.  No penile discharge. No penile lesions or rashes. Scrotum without lesions, cysts, rashes and/or edema.  Testicles are located scrotally bilaterally. No masses are appreciated in the testicles. Left and right epididymis are normal. Rectal: Patient with  normal sphincter tone. Anus and perineum without scarring or rashes. No rectal masses are appreciated. Prostate is approximately 50 + grams, could only palpate the apex and the midportion of gland, no nodules are appreciated. Seminal vesicles could not be palpated.  Lymph: No inguinal adenopathy. Neurologic: Grossly intact, no focal deficits, moving all 4 extremities. Psychiatric: Normal mood and affect.  Laboratory Data: Results for orders placed or performed in visit on 02/05/21  Bladder Scan (Post Void Residual) in office  Result Value Ref Range   Scan Result 1    Component     Latest Ref Rng & Units 11/02/2020  TSH     0.450 - 4.500 uIU/mL 2.220    Component     Latest Ref Rng & Units 11/02/2020  Hemoglobin A1C     4.8 - 5.6 % 5.7 (H)   Component     Latest Ref Rng & Units 11/02/2020  Cholesterol, Total     100 - 199 mg/dL 11/04/2020  Triglycerides     0 - 149 mg/dL 94  HDL Cholesterol     >39 mg/dL 41  VLDL Cholesterol Cal     5 - 40 mg/dL 17  LDL Chol Calc (NIH)     0 - 99 mg/dL 732 (H)  LDL/HDL Ratio     0.0 - 3.6 ratio 2.7   Component     Latest Ref Rng & Units 11/02/2020  Glucose     65 - 99 mg/dL 202 (H)  BUN     6 - 24 mg/dL 17  Creatinine     5.42 - 1.27 mg/dL 7.06  GFR, Est Non African American     >59 mL/min/1.73 80  GFR, Est African American     >59 mL/min/1.73 92  BUN/Creatinine Ratio     9 - 20 15  Sodium     134 - 144 mmol/L 140  Potassium     3.5 - 5.2 mmol/L 5.2  Chloride     96 - 106 mmol/L 102  CO2     20 - 29 mmol/L 26  Calcium     8.7 - 10.2 mg/dL 9.2  Total Protein     6.0 - 8.5 g/dL 7.0  Albumin     4.0 - 5.0 g/dL 4.5  Globulin, Total     1.5 - 4.5 g/dL 2.5  Albumin/Globulin Ratio     1.2 - 2.2 1.8  Total Bilirubin     0.0 - 1.2 mg/dL 0.5  Alkaline Phosphatase     44 - 121 IU/L 109  AST     0 - 40 IU/L 19  ALT     0 - 44 IU/L 23  I have reviewed the labs.  Assessment & Plan:    1. Testosterone deficiency  -Testosterone is at therapeutic levels -Continue testosterone cypionate 200 mg/mL , 0.75 cc IM every two weeks   2. BPH with LUTS - Continue conservative management, avoiding bladder irritants and timed voiding's  3. Erectile dysfunction:    -Continue the Cialis 5 mg daily   Return in about 6 months (around 08/08/2021) for PSA, testosterone (one week after injection) H & H, IPSS, SHIM and exam.  Michiel Cowboy, PA-C  [facility] [address] [place], [place] [address] [phone]

## 2021-02-05 ENCOUNTER — Encounter: Payer: Self-pay | Admitting: Urology

## 2021-02-05 ENCOUNTER — Ambulatory Visit (INDEPENDENT_AMBULATORY_CARE_PROVIDER_SITE_OTHER): Payer: BC Managed Care – PPO | Admitting: Urology

## 2021-02-05 ENCOUNTER — Other Ambulatory Visit: Payer: Self-pay

## 2021-02-05 VITALS — BP 141/90 | HR 86 | Ht 77.0 in | Wt 290.0 lb

## 2021-02-05 DIAGNOSIS — N529 Male erectile dysfunction, unspecified: Secondary | ICD-10-CM

## 2021-02-05 DIAGNOSIS — E349 Endocrine disorder, unspecified: Secondary | ICD-10-CM | POA: Diagnosis not present

## 2021-02-05 DIAGNOSIS — N401 Enlarged prostate with lower urinary tract symptoms: Secondary | ICD-10-CM | POA: Diagnosis not present

## 2021-02-05 DIAGNOSIS — N138 Other obstructive and reflux uropathy: Secondary | ICD-10-CM

## 2021-02-05 LAB — BLADDER SCAN AMB NON-IMAGING: Scan Result: 1

## 2021-02-05 MED ORDER — TADALAFIL 5 MG PO TABS: ORAL_TABLET | ORAL | 2 refills | Status: DC

## 2021-02-13 ENCOUNTER — Other Ambulatory Visit: Payer: Self-pay

## 2021-02-13 ENCOUNTER — Encounter: Payer: Self-pay | Admitting: Podiatry

## 2021-02-13 ENCOUNTER — Ambulatory Visit (INDEPENDENT_AMBULATORY_CARE_PROVIDER_SITE_OTHER): Payer: BC Managed Care – PPO

## 2021-02-13 ENCOUNTER — Ambulatory Visit: Payer: BC Managed Care – PPO | Admitting: Podiatry

## 2021-02-13 DIAGNOSIS — M21861 Other specified acquired deformities of right lower leg: Secondary | ICD-10-CM

## 2021-02-13 DIAGNOSIS — Q6671 Congenital pes cavus, right foot: Secondary | ICD-10-CM

## 2021-02-13 DIAGNOSIS — M722 Plantar fascial fibromatosis: Secondary | ICD-10-CM | POA: Diagnosis not present

## 2021-02-13 DIAGNOSIS — M216X1 Other acquired deformities of right foot: Secondary | ICD-10-CM | POA: Diagnosis not present

## 2021-02-13 MED ORDER — DEXAMETHASONE SODIUM PHOSPHATE 4 MG/ML IJ SOLN
4.0000 mg | Freq: Once | INTRAMUSCULAR | Status: AC
  Administered 2021-02-13: 4 mg

## 2021-02-13 MED ORDER — MELOXICAM 15 MG PO TABS: 15.0000 mg | ORAL_TABLET | Freq: Every day | ORAL | 3 refills | Status: DC

## 2021-02-13 MED ORDER — TRIAMCINOLONE ACETONIDE 10 MG/ML IJ SUSP
10.0000 mg | Freq: Once | INTRAMUSCULAR | Status: AC
  Administered 2021-02-13: 10 mg

## 2021-02-13 NOTE — Progress Notes (Signed)
  Subjective:  Patient ID: Robert Trevino, male    DOB: [birth year],  MRN: [id number]  Chief Complaint  Patient presents with  . Foot Pain    Patient presents for right heel pain x 3 weeks.  He states "it feels like Im walking on a ball in my heel and it can be painful when I get up in the mornings"    50 y.o. male presents with the above complaint. History confirmed with patient. 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   Right Foot: point tenderness over the heel pad, he has gastrocnemius equinus  No images are attached to the encounter.  Radiographs: X-ray of the right foot: Pes cavus foot type, no degenerative changes or heel spur Assessment:   1. Plantar fasciitis of right foot   2. Pes cavus of right foot   3. Gastrocnemius equinus of right lower extremity      Plan:  Patient was evaluated and treated and all questions answered.  Discussed the etiology and treatment options for plantar fasciitis including stretching, formal physical therapy, supportive shoegears such as a running shoe or sneaker, pre fabricated orthoses, injection therapy, and oral medications. We also discussed the role of surgical treatment of this for patients who do not improve after exhausting non-surgical treatment options.   -XR reviewed with patient -Educated patient on stretching and icing of the affected limb -Night splint dispensed -Plantar fascial brace dispensed -Injection delivered to the plantar fascia of the right foot. -Rx for meloxicam. Educated on use, risks and benefits of the medication   After sterile prep with povidone-iodine solution and alcohol, the right heel was injected with 0.5cc 2% xylocaine plain, 0.5cc 0.5% marcaine plain, 5mg  triamcinolone acetonide, and 2mg  dexamethasone was injected along  the plantar fascia  at the insertion on the plantar calcaneus. The patient tolerated the procedure well without complication.   No follow-ups on file.

## 2021-02-13 NOTE — Patient Instructions (Signed)

## 2021-02-25 ENCOUNTER — Other Ambulatory Visit: Payer: Self-pay | Admitting: Urology

## 2021-02-25 DIAGNOSIS — E349 Endocrine disorder, unspecified: Secondary | ICD-10-CM

## 2021-03-20 ENCOUNTER — Ambulatory Visit: Payer: BC Managed Care – PPO | Admitting: Podiatry

## 2021-03-20 ENCOUNTER — Other Ambulatory Visit: Payer: Self-pay

## 2021-03-20 DIAGNOSIS — M722 Plantar fascial fibromatosis: Secondary | ICD-10-CM

## 2021-03-25 ENCOUNTER — Encounter: Payer: Self-pay | Admitting: Podiatry

## 2021-03-25 NOTE — Progress Notes (Signed)
  Subjective:  Patient ID: [name], male    DOB: [birth year],  MRN: [id number]  Chief Complaint  Patient presents with  . Plantar Fasciitis    Right foot, 4 week follow up    50 y.o. male returns with the above complaint. History confirmed with patient.  Overall doing much better after the injection  Objective:  Physical Exam: warm, good capillary refill, no trophic changes or ulcerative lesions, normal DP and PT pulses and normal sensory exam.   Right Foot:Today he has no point tenderness over the heel pad,  Radiographs: X-ray of the right foot: Pes cavus foot type, no degenerative changes or heel spur Assessment:   1. Plantar fasciitis of right foot      Plan:  Patient was evaluated and treated and all questions answered.  Doing much better after the injection.  Continue night splint and plantar fascia brace as needed.  Return further for injection or additional treatment if necessary   Return if symptoms worsen or fail to improve.

## 2021-04-16 ENCOUNTER — Ambulatory Visit: Payer: BC Managed Care – PPO | Admitting: Nurse Practitioner

## 2021-04-29 ENCOUNTER — Ambulatory Visit: Payer: BC Managed Care – PPO | Admitting: Nurse Practitioner

## 2021-05-27 ENCOUNTER — Other Ambulatory Visit: Payer: Self-pay

## 2021-05-27 ENCOUNTER — Ambulatory Visit: Payer: BC Managed Care – PPO | Admitting: Nurse Practitioner

## 2021-05-27 ENCOUNTER — Encounter: Payer: Self-pay | Admitting: Nurse Practitioner

## 2021-05-27 VITALS — BP 137/75 | HR 85 | Temp 97.6°F | Resp 16 | Ht 77.0 in | Wt 292.6 lb

## 2021-05-27 NOTE — Progress Notes (Deleted)
[facility] [address] [place], [place] [address]  Internal MEDICINE  Office Visit Note  Patient Name: Robert Trevino  [id number]  [id number]  Date of Service: 05/27/2021  Chief Complaint  Patient presents with   Follow-up   Sleep Apnea    HPI Robert Trevino presents for a follow-up visit for      Current Medication: Outpatient Encounter Medications as of 05/27/2021  Medication Sig   fluticasone (FLONASE) 50 MCG/ACT nasal spray Place 2 sprays into both nostrils as needed.    meloxicam (MOBIC) 15 MG tablet Take 1 tablet (15 mg total) by mouth daily.   rosuvastatin (CRESTOR) 20 MG tablet    tadalafil (CIALIS) 5 MG tablet TAKE ONE TABLET BY MOUTH DAILY AS NEEDED FOR ERECTILE DYSFUNCTION   testosterone cypionate (DEPOTESTOSTERONE CYPIONATE) 200 MG/ML injection INJECT 0.75 MLS INTO THE MUSCLE EVERY TWO WEEKS   No facility-administered encounter medications on file as of 05/27/2021.    Surgical History: Past Surgical History:  Procedure Laterality Date   NO PAST SURGERIES     verified with patient 10/22/17    Medical History: Past Medical History:  Diagnosis Date   Low testosterone    Sleep apnea     Family History: Family History  Problem Relation Age of Onset   Stroke Father    Diabetes Mother    Healthy Daughter    Kidney disease Neg Hx    Prostate cancer Neg Hx    Kidney cancer Neg Hx    Bladder Cancer Neg Hx     Social History   Socioeconomic History   Marital status: Married    Spouse name: Not on file   Number of children: Not on file   Years of education: Not on file   Highest education level: Not on file  Occupational History   Not on file  Tobacco Use   Smoking status: Never   Smokeless tobacco: Never  Vaping Use   Vaping Use: Never used  Substance and Sexual Activity   Alcohol use: Yes    Comment: 1-2 Beers Weekly   Drug use: No   Sexual activity: Not on file  Other Topics Concern   Not on file  Social History Narrative   Not  on file   Social Determinants of Health   Financial Resource Strain: Not on file  Food Insecurity: Not on file  Transportation Needs: Not on file  Physical Activity: Not on file  Stress: Not on file  Social Connections: Not on file  Intimate Partner Violence: Not on file      Review of Systems  Vital Signs: BP 137/75   Pulse 85   Temp 97.6 F (36.4 C)   Resp 16   Ht 6\' 5"  (1.956 m)   Wt 292 lb 9.6 oz (132.7 kg)   SpO2 93%   BMI 34.70 kg/m    Physical Exam     Assessment/Plan:   General Counseling: Robert Trevino verbalizes understanding of the findings of todays visit and agrees with plan of treatment. I have discussed any further diagnostic evaluation that may be needed or ordered today. We also reviewed his medications today. he has been encouraged to call the office with any questions or concerns that should arise related to todays visit.    No orders of the defined types were placed in this encounter.   No orders of the defined types were placed in this encounter.   No follow-ups on file.   Total time spent:*** Minutes Time spent  includes review of chart, medications, test results, and follow up plan with the patient.   [place] Controlled Substance Database was reviewed by me.  This patient was seen by Robert Kuster, FNP-C in collaboration with Dr. Beverely Trevino as a part of collaborative care agreement.   Robert Trevino R. Robert Sias, MSN, FNP-C Internal medicine

## 2021-08-01 ENCOUNTER — Other Ambulatory Visit: Payer: Self-pay

## 2021-08-01 ENCOUNTER — Other Ambulatory Visit: Payer: BC Managed Care – PPO

## 2021-08-01 DIAGNOSIS — N138 Other obstructive and reflux uropathy: Secondary | ICD-10-CM

## 2021-08-01 DIAGNOSIS — N401 Enlarged prostate with lower urinary tract symptoms: Secondary | ICD-10-CM

## 2021-08-01 DIAGNOSIS — E349 Endocrine disorder, unspecified: Secondary | ICD-10-CM

## 2021-08-02 LAB — HEMOGLOBIN: Hemoglobin: 15.1 g/dL (ref 13.0–17.7)

## 2021-08-02 LAB — HEMATOCRIT: Hematocrit: 43.6 % (ref 37.5–51.0)

## 2021-08-02 LAB — TESTOSTERONE: Testosterone: 923 ng/dL — ABNORMAL HIGH (ref 264–916)

## 2021-08-02 LAB — PSA: Prostate Specific Ag, Serum: 0.8 ng/mL (ref 0.0–4.0)

## 2021-08-07 NOTE — Progress Notes (Signed)
08/19/21 4:29 PM   [name] Mar 20, 1971 [id number]  Referring provider:  Carlean Jews, NP [address] [place],  [place] [address]  Chief Complaint  Patient presents with   Testosterone deficiency   Urological history: 1.  Testosterone deficiency -contributing factors of age and obesity -testosterone level 923 in 07/2021 -H & H WNL in 07/2021 -Managed with testosterone cypionate 200 mg/mL, 0.75 cc every 2 weeks   2. ED -Contributing factors of age, BPH, testosterone deficiency, and sleep apnea (for which he sleeps with a CPAP machine) -SHIM 18  -Managed with tadalafil 5 mg, on-demand dosing   3. BPH with LU TS - PSA 0.8 on 08/01/2021 -IPSS 3/1   HPI: Robert Trevino is a 50 y.o.male who presents today for follow-up with blood work,  IPSS and SHIM.   He has no further urinary symptoms.  Patient denies any modifying or aggravating factors.  Patient denies any gross hematuria, dysuria or suprapubic/flank pain.  Patient denies any fevers, chills, nausea or vomiting.    IPSS     Row Name 08/08/21 1500         International Prostate Symptom Score   How often have you had the sensation of not emptying your bladder? Not at All     How often have you had to urinate less than every two hours? Not at All     How often have you found you stopped and started again several times when you urinated? Less than 1 in 5 times     How often have you found it difficult to postpone urination? Not at All     How often have you had a weak urinary stream? Less than 1 in 5 times     How often have you had to strain to start urination? Not at All     How many times did you typically get up at night to urinate? 1 Time     Total IPSS Score 3           Quality of Life due to urinary symptoms   If you were to spend the rest of your life with your urinary condition just the way it is now how would you feel about that? Pleased              Score:  1-7 Mild 8-19  Moderate 20-35 Severe  Patient still having spontaneous erections.  He denies any pain or curvature with erections.     SHIM     Row Name 08/08/21 1523         SHIM: Over the last 6 months:   How do you rate your confidence that you could get and keep an erection? Moderate     When you had erections with sexual stimulation, how often were your erections hard enough for penetration (entering your partner)? Most Times (much more than half the time)     During sexual intercourse, how often were you able to maintain your erection after you had penetrated (entered) your partner? Sometimes (about half the time)     During sexual intercourse, how difficult was it to maintain your erection to completion of intercourse? Slightly Difficult     When you attempted sexual intercourse, how often was it satisfactory for you? Most Times (much more than half the time)           SHIM Total Score   SHIM 18  PMH: Past Medical History:  Diagnosis Date   Low testosterone    Sleep apnea     Surgical History: Past Surgical History:  Procedure Laterality Date   NO PAST SURGERIES     verified with patient 10/22/17    Home Medications:  Allergies as of 08/08/2021   No Known Allergies      Medication List        Accurate as of August 08, 2021 11:59 PM. If you have any questions, ask your nurse or doctor.          fluticasone 50 MCG/ACT nasal spray Commonly known as: FLONASE Place 2 sprays into both nostrils as needed.   meloxicam 15 MG tablet Commonly known as: Mobic Take 1 tablet (15 mg total) by mouth daily.   rosuvastatin 20 MG tablet Commonly known as: CRESTOR   tadalafil 5 MG tablet Commonly known as: CIALIS TAKE ONE TABLET BY MOUTH DAILY AS NEEDED FOR ERECTILE DYSFUNCTION   testosterone cypionate 200 MG/ML injection Commonly known as: DEPOTESTOSTERONE CYPIONATE INJECT 0.75 MLS INTO THE MUSCLE EVERY TWO WEEKS        Allergies:  No Known  Allergies  Family History: Family History  Problem Relation Age of Onset   Stroke Father    Diabetes Mother    Healthy Daughter    Kidney disease Neg Hx    Prostate cancer Neg Hx    Kidney cancer Neg Hx    Bladder Cancer Neg Hx     Social History:  reports that he has never smoked. He has never used smokeless tobacco. He reports current alcohol use. He reports that he does not use drugs.   Physical Exam: BP 130/79   Pulse 71   Ht 6\' 5"  (1.956 m)   Wt 292 lb (132.5 kg)   BMI 34.63 kg/m   Constitutional:  Alert and oriented, No acute distress. HEENT: [name], mask in place.  Trachea midline Cardiovascular: No clubbing, cyanosis, or edema. Respiratory: Normal respiratory effort, no increased work of breathing. GU: No CVA tenderness.  No bladder fullness or masses.  Patient with circumcised phallus.  Urethral meatus is patent.  No penile discharge. No penile lesions or rashes. Scrotum without lesions, cysts, rashes and/or edema.  Testicles are located scrotally bilaterally. No masses are appreciated in the testicles. Left and right epididymis are normal. Rectal: Patient with  normal sphincter tone. Anus and perineum without scarring or rashes. No rectal masses are appreciated. Prostate is approximately  50+ grams, could only palpate the apex and the midportion of gland, no nodules are appreciated. Seminal vesicles could not be palpated.  Neurologic: Grossly intact, no focal deficits, moving all 4 extremities. Psychiatric: Normal mood and affect.  Laboratory Data: Lab Results  Component Value Date   CREATININE 1.10 11/02/2020    Component     Latest Ref Rng & Units 08/01/2021  Testosterone     264 - 916 ng/dL 08/03/2021 (H)   Component     Latest Ref Rng & Units 08/01/2021  HCT     37.5 - 51.0 % 43.6   Component     Latest Ref Rng & Units 08/01/2021  Hemoglobin     13.0 - 17.7 g/dL 08/03/2021   Component Prostate Specific Ag, Serum  Latest Ref Rng & Units 0.0 - 4.0 ng/mL  12/16/2016  0.6  04/22/2017 0.7  10/20/2017 0.7  04/14/2018 0.7  11/03/2018 0.6  02/02/2019 0.7  05/30/2019 0.6  02/01/2020 0.6  01/30/2021 0.8  08/01/2021 0.8  Lab Results  Component Value Date   HGBA1C 5.7 (H) 11/02/2020  I have reviewed the labs.   Pertinent Imaging: N/A  Assessment & Plan:    Testosterone deficiency  - At therapeutic levels  - continue testosterone cypionate 200 mg/ml, 0.75 cc IM every two weeks  Erectile dysfunction  - Continue Cialis 5 mg daily; refill sent   3. BPH with LUTS -PSA stable -DRE benign -continue conservative management, avoiding bladder irritants and timed voiding's  Return in about 6 months (around 02/05/2022) for PSA, testosterone (one week after injection) [facility] [address] [place], [place] [address] [phone]  [facility] as a scribe for [facility], PA-C.,have documented all relevant documentation on the behalf of Zaelyn Barbary, PA-C,as directed by  [facility], PA-C while in the presence of Shaana Acocella, PA-C.  I have reviewed the above documentation for accuracy and completeness, and I agree with the above.    Michiel Cowboy, PA-C

## 2021-08-08 ENCOUNTER — Other Ambulatory Visit: Payer: Self-pay

## 2021-08-08 ENCOUNTER — Encounter: Payer: Self-pay | Admitting: Urology

## 2021-08-08 ENCOUNTER — Ambulatory Visit (INDEPENDENT_AMBULATORY_CARE_PROVIDER_SITE_OTHER): Payer: BC Managed Care – PPO | Admitting: Urology

## 2021-08-08 VITALS — BP 130/79 | HR 71 | Ht 77.0 in | Wt 292.0 lb

## 2021-08-08 DIAGNOSIS — E349 Endocrine disorder, unspecified: Secondary | ICD-10-CM

## 2021-08-08 DIAGNOSIS — N401 Enlarged prostate with lower urinary tract symptoms: Secondary | ICD-10-CM | POA: Diagnosis not present

## 2021-08-08 DIAGNOSIS — N529 Male erectile dysfunction, unspecified: Secondary | ICD-10-CM | POA: Diagnosis not present

## 2021-08-08 DIAGNOSIS — N138 Other obstructive and reflux uropathy: Secondary | ICD-10-CM | POA: Diagnosis not present

## 2021-08-08 MED ORDER — TESTOSTERONE CYPIONATE 200 MG/ML IM SOLN: INTRAMUSCULAR | 0 refills | Status: DC

## 2021-08-08 MED ORDER — TADALAFIL 5 MG PO TABS: ORAL_TABLET | ORAL | 2 refills | Status: DC

## 2021-10-21 ENCOUNTER — Encounter: Payer: BC Managed Care – PPO | Admitting: Nurse Practitioner

## 2021-12-06 NOTE — Progress Notes (Signed)
Appointment was cancelled.

## 2022-02-06 ENCOUNTER — Encounter: Payer: Self-pay | Admitting: Urology

## 2022-02-06 ENCOUNTER — Other Ambulatory Visit: Payer: BC Managed Care – PPO

## 2022-03-06 ENCOUNTER — Other Ambulatory Visit: Payer: BC Managed Care – PPO

## 2022-03-06 DIAGNOSIS — E349 Endocrine disorder, unspecified: Secondary | ICD-10-CM

## 2022-03-07 LAB — TESTOSTERONE: Testosterone: 981 ng/dL — ABNORMAL HIGH (ref 264–916)

## 2022-03-07 LAB — HEMOGLOBIN AND HEMATOCRIT, BLOOD
Hematocrit: 42.5 % (ref 37.5–51.0)
Hemoglobin: 14.4 g/dL (ref 13.0–17.7)

## 2022-03-07 LAB — PSA: Prostate Specific Ag, Serum: 0.6 ng/mL (ref 0.0–4.0)

## 2022-03-28 ENCOUNTER — Other Ambulatory Visit: Payer: Self-pay | Admitting: Urology

## 2022-03-28 DIAGNOSIS — E349 Endocrine disorder, unspecified: Secondary | ICD-10-CM

## 2022-04-01 ENCOUNTER — Telehealth: Payer: Self-pay | Admitting: Urology

## 2022-04-01 NOTE — Telephone Encounter (Signed)
We have to get Robert Trevino scheduled for an appointment in October for IPSS, SH IM and exam with PSA, testosterone, hemoglobin hematocrit prior to the appointment before I can refill the testosterone. ?

## 2022-04-02 NOTE — Telephone Encounter (Signed)
Appts made for October, lab and office visit. Pt confirmed and expressed understanding.  ?

## 2022-04-30 ENCOUNTER — Ambulatory Visit: Payer: BC Managed Care – PPO | Admitting: Nurse Practitioner

## 2022-04-30 ENCOUNTER — Encounter: Payer: Self-pay | Admitting: Nurse Practitioner

## 2022-04-30 VITALS — BP 113/71 | HR 63 | Temp 97.7°F | Ht 77.0 in | Wt 295.0 lb

## 2022-04-30 DIAGNOSIS — R5383 Other fatigue: Secondary | ICD-10-CM

## 2022-04-30 DIAGNOSIS — L819 Disorder of pigmentation, unspecified: Secondary | ICD-10-CM

## 2022-04-30 DIAGNOSIS — E782 Mixed hyperlipidemia: Secondary | ICD-10-CM

## 2022-04-30 DIAGNOSIS — I8311 Varicose veins of right lower extremity with inflammation: Secondary | ICD-10-CM | POA: Diagnosis not present

## 2022-04-30 DIAGNOSIS — I8312 Varicose veins of left lower extremity with inflammation: Secondary | ICD-10-CM

## 2022-04-30 DIAGNOSIS — L209 Atopic dermatitis, unspecified: Secondary | ICD-10-CM | POA: Diagnosis not present

## 2022-04-30 DIAGNOSIS — Z0000 Encounter for general adult medical examination without abnormal findings: Secondary | ICD-10-CM

## 2022-04-30 DIAGNOSIS — Z7689 Persons encountering health services in other specified circumstances: Secondary | ICD-10-CM

## 2022-04-30 DIAGNOSIS — M064 Inflammatory polyarthropathy: Secondary | ICD-10-CM

## 2022-04-30 DIAGNOSIS — R600 Localized edema: Secondary | ICD-10-CM

## 2022-04-30 MED ORDER — TRIAMCINOLONE ACETONIDE 0.025 % EX CREA: 1.0000 "application " | TOPICAL_CREAM | Freq: Two times a day (BID) | CUTANEOUS | 2 refills | Status: DC

## 2022-04-30 NOTE — Patient Instructions (Signed)
Edema ? ?Edema is when you have too much fluid in your body or under your skin. Edema may make your legs, feet, and ankles swell. Swelling often happens in looser tissues, such as around your eyes. This is a common condition. It gets more common as you get older. ?There are many possible causes of edema. These include: ?Eating too much salt (sodium). ?Being on your feet or sitting for a long time. ?Certain medical conditions, such as: ?Pregnancy. ?Heart failure. ?Liver disease. ?Kidney disease. ?Cancer. ?Hot weather may make edema worse. Edema is usually painless. Your skin may look swollen or shiny. ?Follow these instructions at home: ?Medicines ?Take over-the-counter and prescription medicines only as told by your doctor. ?Your doctor may prescribe a medicine to help your body get rid of extra water (diuretic). Take this medicine if you are told to take it. ?Eating and drinking ?Eat a low-salt (low-sodium) diet as told by your doctor. Sometimes, eating less salt may reduce swelling. ?Depending on the cause of your swelling, you may need to limit how much fluid you drink (fluid restriction). ?General instructions ?Raise the injured area above the level of your heart while you are sitting or lying down. ?Do not sit still or stand for a long time. ?Do not wear tight clothes. Do not wear garters on your upper legs. ?Exercise your legs. This can help the swelling go down. ?Wear compression stockings as told by your doctor. It is important that these are the right size. These should be prescribed by your doctor to prevent possible injuries. ?If elastic bandages or wraps are recommended, use them as told by your doctor. ?Contact a doctor if: ?Treatment is not working. ?You have heart, liver, or kidney disease and have symptoms of edema. ?You have sudden and unexplained weight gain. ?Get help right away if: ?You have shortness of breath or chest pain. ?You cannot breathe when you lie down. ?You have pain, redness, or  warmth in the swollen areas. ?You have heart, liver, or kidney disease and get edema all of a sudden. ?You have a fever and your symptoms get worse all of a sudden. ?These symptoms may be an emergency. Get help right away. Call 911. ?Do not wait to see if the symptoms will go away. ?Do not drive yourself to the hospital. ?Summary ?Edema is when you have too much fluid in your body or under your skin. ?Edema may make your legs, feet, and ankles swell. Swelling often happens in looser tissues, such as around your eyes. ?Raise the injured area above the level of your heart while you are sitting or lying down. ?Follow your doctor's instructions about diet and how much fluid you can drink. ?This information is not intended to replace advice given to you by your health care provider. Make sure you discuss any questions you have with your health care provider. ?Document Revised: 07/08/2021 Document Reviewed: 07/08/2021 ?Elsevier Patient Education ? 2023 Elsevier Inc. ? ?

## 2022-04-30 NOTE — Progress Notes (Unsigned)
   New Patient Office Visit  Subjective    Patient ID: Robert Trevino, male    DOB: September 16, 1971  Age: 51 y.o. MRN: [id number]  CC:  Chief Complaint  Patient presents with   New Patient (Initial Visit)    HPI Robert Trevino presents to establish care Pain and itching in both lower legs.  -pain in knees and hands - blood work testing needed for connective tissue disorders.   Outpatient Encounter Medications as of 04/30/2022  Medication Sig   fluticasone (FLONASE) 50 MCG/ACT nasal spray Place 2 sprays into both nostrils as needed.    meloxicam (MOBIC) 15 MG tablet Take 1 tablet (15 mg total) by mouth daily.   rosuvastatin (CRESTOR) 20 MG tablet    tadalafil (CIALIS) 5 MG tablet TAKE ONE TABLET BY MOUTH DAILY AS NEEDED FOR ERECTILE DYSFUNCTION   testosterone cypionate (DEPOTESTOSTERONE CYPIONATE) 200 MG/ML injection INJECT 0.75ML INTO THE MUSCLE EVERY TWO WEEKS   No facility-administered encounter medications on file as of 04/30/2022.    Past Medical History:  Diagnosis Date   Low testosterone    Sleep apnea     Past Surgical History:  Procedure Laterality Date   NO PAST SURGERIES     verified with patient 10/22/17    Family History  Problem Relation Age of Onset   Stroke Father    Diabetes Mother    Healthy Daughter    Kidney disease Neg Hx    Prostate cancer Neg Hx    Kidney cancer Neg Hx    Bladder Cancer Neg Hx     Social History   Socioeconomic History   Marital status: Married    Spouse name: Not on file   Number of children: Not on file   Years of education: Not on file   Highest education level: Not on file  Occupational History   Not on file  Tobacco Use   Smoking status: Never   Smokeless tobacco: Never  Vaping Use   Vaping Use: Never used  Substance and Sexual Activity   Alcohol use: Yes    Comment: 1-2 Beers Weekly   Drug use: No   Sexual activity: Not on file  Other Topics Concern   Not on file  Social History Narrative   Not on file    Social Determinants of Health   Financial Resource Strain: Not on file  Food Insecurity: Not on file  Transportation Needs: Not on file  Physical Activity: Not on file  Stress: Not on file  Social Connections: Not on file  Intimate Partner Violence: Not on file    ROS      Objective    BP 113/71   Pulse 63   Temp 97.7 F (36.5 C)   Ht 6\' 5"  (1.956 m)   Wt 295 lb (133.8 kg)   SpO2 94%   BMI 34.98 kg/m   Physical Exam  {Labs (Optional):23779}    Assessment & Plan:   Problem List Items Addressed This Visit       Musculoskeletal and Integument   Atopic dermatitis   Other Visit Diagnoses     Lower extremity edema    -  Primary   Discoloration of skin           No follow-ups on file.   , NP

## 2022-05-03 LAB — COMPREHENSIVE METABOLIC PANEL
ALT: 24 IU/L (ref 0–44)
AST: 20 IU/L (ref 0–40)
Albumin/Globulin Ratio: 1.9 (ref 1.2–2.2)
Albumin: 4.2 g/dL (ref 4.0–5.0)
Alkaline Phosphatase: 86 IU/L (ref 44–121)
BUN/Creatinine Ratio: 14 (ref 9–20)
BUN: 14 mg/dL (ref 6–24)
Bilirubin Total: 0.3 mg/dL (ref 0.0–1.2)
CO2: 22 mmol/L (ref 20–29)
Calcium: 8.8 mg/dL (ref 8.7–10.2)
Chloride: 105 mmol/L (ref 96–106)
Creatinine, Ser: 1.02 mg/dL (ref 0.76–1.27)
Globulin, Total: 2.2 g/dL (ref 1.5–4.5)
Glucose: 109 mg/dL — ABNORMAL HIGH (ref 70–99)
Potassium: 4.8 mmol/L (ref 3.5–5.2)
Sodium: 141 mmol/L (ref 134–144)
Total Protein: 6.4 g/dL (ref 6.0–8.5)
eGFR: 90 mL/min/{1.73_m2} (ref >59)

## 2022-05-03 LAB — HEMOGLOBIN A1C
Est. average glucose Bld gHb Est-mCnc: 117 mg/dL
Hgb A1c MFr Bld: 5.7 % — ABNORMAL HIGH (ref 4.8–5.6)

## 2022-05-03 LAB — RHEUMATOID FACTOR: Rheumatoid fact SerPl-aCnc: <10 IU/mL (ref <14.0)

## 2022-05-03 LAB — LIPID PANEL
Chol/HDL Ratio: 4.7 ratio (ref 0.0–5.0)
Cholesterol, Total: 159 mg/dL (ref 100–199)
HDL: 34 mg/dL — ABNORMAL LOW (ref >39)
LDL Chol Calc (NIH): 106 mg/dL — ABNORMAL HIGH (ref 0–99)
Triglycerides: 103 mg/dL (ref 0–149)
VLDL Cholesterol Cal: 19 mg/dL (ref 5–40)

## 2022-05-03 LAB — CBC
Hematocrit: 45.9 % (ref 37.5–51.0)
Hemoglobin: 15.7 g/dL (ref 13.0–17.7)
MCH: 31.2 pg (ref 26.6–33.0)
MCHC: 34.2 g/dL (ref 31.5–35.7)
MCV: 91 fL (ref 79–97)
Platelets: 300 10*3/uL (ref 150–450)
RBC: 5.03 x10E6/uL (ref 4.14–5.80)
RDW: 12.6 % (ref 11.6–15.4)
WBC: 7 10*3/uL (ref 3.4–10.8)

## 2022-05-03 LAB — TSH+FREE T4
Free T4: 1.19 ng/dL (ref 0.82–1.77)
TSH: 2.09 u[IU]/mL (ref 0.450–4.500)

## 2022-05-03 LAB — ANA W/REFLEX IF POSITIVE: Anti Nuclear Antibody (ANA): NEGATIVE

## 2022-05-03 LAB — SEDIMENTATION RATE: Sed Rate: 7 mm/hr (ref 0–15)

## 2022-05-05 ENCOUNTER — Encounter: Payer: Self-pay | Admitting: Nurse Practitioner

## 2022-05-06 ENCOUNTER — Other Ambulatory Visit: Payer: Self-pay | Admitting: Nurse Practitioner

## 2022-05-06 DIAGNOSIS — M064 Inflammatory polyarthropathy: Secondary | ICD-10-CM

## 2022-05-06 MED ORDER — CELECOXIB 200 MG PO CAPS: 200.0000 mg | ORAL_CAPSULE | Freq: Two times a day (BID) | ORAL | 1 refills | Status: DC | PRN

## 2022-05-06 NOTE — Progress Notes (Signed)
Changed meloxicam to celebrex 200mg  twice daily as needed. New prescription sent to [facility] in [place].

## 2022-05-11 DIAGNOSIS — M064 Inflammatory polyarthropathy: Secondary | ICD-10-CM | POA: Insufficient documentation

## 2022-05-11 DIAGNOSIS — R5383 Other fatigue: Secondary | ICD-10-CM | POA: Insufficient documentation

## 2022-05-11 DIAGNOSIS — R600 Localized edema: Secondary | ICD-10-CM | POA: Insufficient documentation

## 2022-05-11 DIAGNOSIS — L819 Disorder of pigmentation, unspecified: Secondary | ICD-10-CM | POA: Insufficient documentation

## 2022-05-27 ENCOUNTER — Ambulatory Visit (INDEPENDENT_AMBULATORY_CARE_PROVIDER_SITE_OTHER): Payer: BC Managed Care – PPO | Admitting: Nurse Practitioner

## 2022-05-27 ENCOUNTER — Encounter: Payer: Self-pay | Admitting: Nurse Practitioner

## 2022-05-27 ENCOUNTER — Telehealth: Payer: Self-pay

## 2022-05-27 VITALS — BP 117/73 | HR 67 | Ht 77.17 in | Wt 300.0 lb

## 2022-05-27 DIAGNOSIS — Z0001 Encounter for general adult medical examination with abnormal findings: Secondary | ICD-10-CM | POA: Diagnosis not present

## 2022-05-27 DIAGNOSIS — E782 Mixed hyperlipidemia: Secondary | ICD-10-CM | POA: Diagnosis not present

## 2022-05-27 DIAGNOSIS — L209 Atopic dermatitis, unspecified: Secondary | ICD-10-CM

## 2022-05-27 DIAGNOSIS — R7301 Impaired fasting glucose: Secondary | ICD-10-CM

## 2022-05-27 DIAGNOSIS — M79661 Pain in right lower leg: Secondary | ICD-10-CM

## 2022-05-27 DIAGNOSIS — M25561 Pain in right knee: Secondary | ICD-10-CM

## 2022-05-27 NOTE — Telephone Encounter (Signed)
Per [facility], SS report faxed to her @ [phone]

## 2022-05-27 NOTE — Progress Notes (Signed)
Complete physical exam   Patient: Robert Trevino   DOB: Mar 21, 1971   51 y.o. Male  MRN: [id number] Visit Date: 05/27/2022    Chief Complaint  Patient presents with   Annual Exam   Subjective    Robert Trevino is a 51 y.o. male who presents today for a complete physical exam.  He reports consuming a  generally healthly  diet. The patient has a physically strenuous job, but has no regular exercise apart from work.  He generally feels well. He does have additional problems to discuss today.   HPI  Annual physical  -labs done 05/02/2022 and showed  --mild elevation of glucose and HgbA1c --mildly elevated ldl and mildly low HDL -had been referred to vein and vascular. Has appointment for august 8 for initial patient appointment.  --severe varicose veins bilaterally  --itching and redness of the skin of both lower extremities.  -had connective tissue panel done due to generalized joint pain.  --negative labs --was started on celebrex 200 mg twice daily as needed. He states that this has made a big improvement.     Past Medical History:  Diagnosis Date   Low testosterone    Sleep apnea    Past Surgical History:  Procedure Laterality Date   NO PAST SURGERIES     verified with patient 10/22/17   Social History   Socioeconomic History   Marital status: Married    Spouse name: Not on file   Number of children: Not on file   Years of education: Not on file   Highest education level: Not on file  Occupational History   Not on file  Tobacco Use   Smoking status: Never   Smokeless tobacco: Never  Vaping Use   Vaping Use: Never used  Substance and Sexual Activity   Alcohol use: Yes    Comment: 1-2 Beers Weekly   Drug use: No   Sexual activity: Not on file  Other Topics Concern   Not on file  Social History Narrative   Not on file   Social Determinants of Health   Financial Resource Strain: Not on file  Food Insecurity: Not on file  Transportation Needs: Not on file   Physical Activity: Not on file  Stress: Not on file  Social Connections: Not on file  Intimate Partner Violence: Not on file   Family Status  Relation Name Status   Father  Deceased   Mother  Alive   Daughter  (Not Specified)   Neg Hx  (Not Specified)   Family History  Problem Relation Age of Onset   Stroke Father    Diabetes Mother    Healthy Daughter    Kidney disease Neg Hx    Prostate cancer Neg Hx    Kidney cancer Neg Hx    Bladder Cancer Neg Hx    No Known Allergies  Patient Care Team: Ronnell Freshwater, NP as PCP - General (Family Medicine)   Medications: Outpatient Medications Prior to Visit  Medication Sig   celecoxib (CELEBREX) 200 MG capsule Take 1 capsule (200 mg total) by mouth 2 (two) times daily as needed.   fluticasone (FLONASE) 50 MCG/ACT nasal spray Place 2 sprays into both nostrils as needed.    rosuvastatin (CRESTOR) 20 MG tablet    tadalafil (CIALIS) 5 MG tablet TAKE ONE TABLET BY MOUTH DAILY AS NEEDED FOR ERECTILE DYSFUNCTION   testosterone cypionate (DEPOTESTOSTERONE CYPIONATE) 200 MG/ML injection INJECT 0.75ML INTO THE MUSCLE EVERY TWO WEEKS  triamcinolone (KENALOG) 0.025 % cream Apply 1 application  topically 2 (two) times daily.   No facility-administered medications prior to visit.    Review of Systems  Constitutional:  Negative for activity change, chills, fatigue and fever.  HENT:  Negative for congestion, postnasal drip, rhinorrhea, sinus pressure, sinus pain, sneezing and sore throat.   Eyes: Negative.   Respiratory:  Negative for cough, shortness of breath and wheezing.   Cardiovascular:  Positive for leg swelling. Negative for chest pain and palpitations.       Moderate to severe varicose veins, bilaterally   Gastrointestinal:  Negative for constipation, diarrhea, nausea and vomiting.  Endocrine: Negative for cold intolerance, heat intolerance, polydipsia and polyuria.  Genitourinary:  Negative for dysuria, frequency and urgency.   Musculoskeletal:  Positive for arthralgias, joint swelling and myalgias. Negative for back pain.  Skin:  Positive for rash.       Severe itching of the lower extremities    Allergic/Immunologic: Positive for environmental allergies.  Neurological:  Negative for dizziness, weakness and headaches.  Psychiatric/Behavioral:  The patient is not nervous/anxious.     Last CBC Lab Results  Component Value Date   WBC 7.0 05/02/2022   HGB 15.7 05/02/2022   HCT 45.9 05/02/2022   MCV 91 05/02/2022   MCH 31.2 05/02/2022   RDW 12.6 05/02/2022   PLT 300 76/80/8811   Last metabolic panel Lab Results  Component Value Date   GLUCOSE 109 (H) 05/02/2022   NA 141 05/02/2022   K 4.8 05/02/2022   CL 105 05/02/2022   CO2 22 05/02/2022   BUN 14 05/02/2022   CREATININE 1.02 05/02/2022   EGFR 90 05/02/2022   CALCIUM 8.8 05/02/2022   PROT 6.4 05/02/2022   ALBUMIN 4.2 05/02/2022   LABGLOB 2.2 05/02/2022   AGRATIO 1.9 05/02/2022   BILITOT 0.3 05/02/2022   ALKPHOS 86 05/02/2022   AST 20 05/02/2022   ALT 24 05/02/2022   Last lipids Lab Results  Component Value Date   CHOL 159 05/02/2022   HDL 34 (L) 05/02/2022   LDLCALC 106 (H) 05/02/2022   TRIG 103 05/02/2022   CHOLHDL 4.7 05/02/2022   Last hemoglobin A1c Lab Results  Component Value Date   HGBA1C 5.7 (H) 05/02/2022   Last thyroid functions Lab Results  Component Value Date   TSH 2.090 05/02/2022   T3TOTAL 147 05/30/2019        Objective     Today's Vitals   05/27/22 1352  BP: 117/73  Pulse: 67  SpO2: 93%  Weight: 300 lb (136.1 kg)  Height: 6' 5.17" (1.96 m)   Body mass index is 35.42 kg/m.   BP Readings from Last 3 Encounters:  05/27/22 117/73  04/30/22 113/71  08/08/21 130/79    Wt Readings from Last 3 Encounters:  05/27/22 300 lb (136.1 kg)  04/30/22 295 lb (133.8 kg)  08/08/21 292 lb (132.5 kg)     Physical Exam Vitals and nursing note reviewed.  Constitutional:      Appearance: Normal appearance. He  is well-developed.  HENT:     Head: Normocephalic and atraumatic.     Right Ear: Tympanic membrane, ear canal and external ear normal.     Left Ear: Tympanic membrane, ear canal and external ear normal.     Nose: Nose normal.     Mouth/Throat:     Mouth: Mucous membranes are moist.     Pharynx: Oropharynx is clear.  Eyes:     Extraocular Movements: Extraocular movements intact.  Conjunctiva/sclera: Conjunctivae normal.     Pupils: Pupils are equal, round, and reactive to light.  Neck:     Vascular: No carotid bruit.  Cardiovascular:     Rate and Rhythm: Normal rate and regular rhythm.     Pulses: Normal pulses.     Heart sounds: Normal heart sounds.     Comments: : Varicose veins with evidence of inflammation of bilateral lower legs.  Pulmonary:     Effort: Pulmonary effort is normal.     Breath sounds: Normal breath sounds.  Abdominal:     General: Bowel sounds are normal. There is no distension.     Palpations: Abdomen is soft. There is no mass.     Tenderness: There is no abdominal tenderness. There is no right CVA tenderness, left CVA tenderness, guarding or rebound.     Hernia: No hernia is present.  Musculoskeletal:        General: Normal range of motion.     Cervical back: Normal range of motion and neck supple.  Lymphadenopathy:     Cervical: No cervical adenopathy.  Skin:    General: Skin is warm and dry.     Capillary Refill: Capillary refill takes less than 2 seconds.     Findings: Erythema present.     Comments: Moderate erythema and itching of bilateral lower extremities   Neurological:     General: No focal deficit present.     Mental Status: He is alert and oriented to person, place, and time.  Psychiatric:        Mood and Affect: Mood normal.        Behavior: Behavior normal.        Thought Content: Thought content normal.        Judgment: Judgment normal.      Last depression screening scores    05/27/2022    1:56 PM 04/30/2022    4:07 PM  04/30/2022    3:58 PM  PHQ 2/9 Scores  PHQ - 2 Score 0 0 0  PHQ- 9 Score 1 2 0   Last fall risk screening    04/30/2022    3:58 PM  [place] in the past year? 0  Number falls in past yr: 0  Injury with Fall? 0  Risk for fall due to : No Fall Risks  Follow up Falls evaluation completed   Last Audit-C alcohol use screening    10/16/2020    3:39 PM  Alcohol Use Disorder Test (AUDIT)  1. How often do you have a drink containing alcohol? 3  2. How many drinks containing alcohol do you have on a typical day when you are drinking? 1   A score of 3 or more in women, and 4 or more in men indicates increased risk for alcohol abuse, EXCEPT if all of the points are from question 1    Assessment & Plan    1. Encounter for general adult medical examination with abnormal findings Annual physical today   2. Mixed hyperlipidemia Reviewed fasting lipid panel showing mild elevation of LDL and low HDL.  Continue Crestor 20 mg daily.  Encouraged him to limit fried and fatty foods in the diet and increasing healthy protein and fresh vegetables.  Recheck fasting lipids in 1 year.  3. Atopic dermatitis, unspecified type May continue to use triamcinolone cream twice daily as needed.  4. Impaired fasting glucose Mild elevation of glucose and hemoglobin A1c.  Encouraged him to limit intake of  carbohydrates and sugar.  Check plenty of water.  Recheck in 1 year.  5. Pain of knee and lower leg, right Improved.  Continue Celebrex 200 mg twice daily as needed.  Consider referral to orthopedics in the future.   Health Maintenance  Topic Date Due   COVID-19 Vaccine (1) Never done   HIV Screening  Never done   TETANUS/TDAP  Never done   COLONOSCOPY (Pts 45-103yr Insurance coverage will need to be confirmed)  11/13/2023 (Originally 11/19/2017)   INFLUENZA VACCINE  06/17/2022   HPV VACCINES  Aged Out   Hepatitis C Screening  Discontinued    Discussed health benefits of physical activity, and  encouraged him to engage in regular exercise appropriate for his age and condition.  Problem List Items Addressed This Visit       Endocrine   Impaired fasting glucose     Musculoskeletal and Integument   Atopic dermatitis     Other   Mixed hyperlipidemia   Encounter for general adult medical examination with abnormal findings - Primary   Pain of knee and lower leg, right     Return in about 6 months (around 11/27/2022) for hld, vasculitis - will get Tdap at this time. .Marland Kitchen       HRonnell Freshwater NP  [phone](phone) [phone](fax)  CM[employer]ual

## 2022-06-02 ENCOUNTER — Other Ambulatory Visit: Payer: Self-pay | Admitting: *Deleted

## 2022-06-02 DIAGNOSIS — I8311 Varicose veins of right lower extremity with inflammation: Secondary | ICD-10-CM

## 2022-06-12 ENCOUNTER — Other Ambulatory Visit: Payer: Self-pay | Admitting: Urology

## 2022-06-12 DIAGNOSIS — E349 Endocrine disorder, unspecified: Secondary | ICD-10-CM

## 2022-06-15 DIAGNOSIS — R7301 Impaired fasting glucose: Secondary | ICD-10-CM | POA: Insufficient documentation

## 2022-06-23 NOTE — Progress Notes (Unsigned)
[facility] [place]  ASSESSMENT / PLAN: Robert Trevino is a 51 y.o. male with chronic venous insufficiency of bilateral lower extremities causing swelling (C2 disease).  Venous duplex is significant for previous greater saphenous vein ablation; deep vein reflux. I encouraged him to avoid further intervention for scattered varicosities across his left lower extremity. Recommend compression and elevation for symptomatic relief. I recommended he lose about 50 pounds.  I encouraged him to reach out to his primary care physician to discuss medical weight loss strategies. Follow up with me as needed.  CHIEF COMPLAINT: Leg swelling  HISTORY OF PRESENT ILLNESS: Robert Trevino is a 51 y.o. male referred to clinic for evaluation of bilateral lower extremity swelling, left worse than right.  The patient reports fairly typical symptoms of chronic venous insufficiency: He reports worsening swelling throughout the day exacerbated by prolonged standing.  The swelling is relieved by dependency.  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  He does notice some improvement with elevation.  He has had multiple interventions at [facility] with minimal improvement.  VASCULAR SURGICAL HISTORY:  Remote greater saphenous vein ablation foam sclerotherapy of varicosities of LLE 10/09/20 ([facility])  Past Medical History:  Diagnosis Date   Low testosterone    Sleep apnea     Past Surgical History:  Procedure Laterality Date   NO PAST SURGERIES     verified with patient 10/22/17    Family History  Problem Relation Age of Onset   Stroke Father    Diabetes Mother    Healthy Daughter    Kidney disease Neg Hx    Prostate cancer Neg Hx    Kidney cancer Neg Hx    Bladder Cancer Neg Hx     Social History   Socioeconomic History   Marital status: Married    Spouse name: Not  on file   Number of children: Not on file   Years of education: Not on file   Highest education level: Not on file  Occupational History   Not on file  Tobacco Use   Smoking status: Never   Smokeless tobacco: Never  Vaping Use   Vaping Use: Never used  Substance and Sexual Activity   Alcohol use: Yes    Comment: 1-2 Beers Weekly   Drug use: No   Sexual activity: Not on file  Other Topics Concern   Not on file  Social History Narrative   Not on file   Social Determinants of Health   Financial Resource Strain: Not on file  Food Insecurity: Not on file  Transportation Needs: Not on file  Physical Activity: Not on file  Stress: Not on file  Social Connections: Not on file  Intimate Partner Violence: Not on file    No Known Allergies  Current Outpatient Medications  Medication Sig Dispense Refill   celecoxib (CELEBREX) 200 MG capsule Take 1 capsule (200 mg total) by mouth 2 (two) times daily as needed. 60 capsule 1   fluticasone (FLONASE) 50 MCG/ACT nasal spray Place 2 sprays into both nostrils as needed.      rosuvastatin (CRESTOR) 20 MG tablet   0   tadalafil (CIALIS) 5 MG tablet TAKE ONE TABLET BY MOUTH DAILY AS NEEDED FOR ERECTILE DYSFUNCTION 90 tablet 2   testosterone cypionate (DEPOTESTOSTERONE CYPIONATE) 200 MG/ML injection INJECT 0.75ML INTO THE MUSCLE EVERY 2 WEEKS. DISCARD REMAINDER OF VIAL 10  mL 0   triamcinolone (KENALOG) 0.025 % cream Apply 1 application  topically 2 (two) times daily. 80 g 2   No current facility-administered medications for this visit.    PHYSICAL EXAM Vitals:   06/24/22 0838  BP: 109/68  Pulse: 60  Resp: 20  Temp: 98.4 F (36.9 C)  SpO2: 95%  Weight: 300 lb (136.1 kg)  Height: 6\' 5"  (1.956 m)   Well-appearing young man in no acute distress Regular rate and rhythm Unlabored breathing Easily palpable pedal pulses Chronic venous insufficiency of bilateral lower extremities: Eczema type rash about the ankles, prominent varicosities  left worse than right.  PERTINENT LABORATORY AND RADIOLOGIC DATA  Most recent CBC    Latest Ref Rng & Units 05/02/2022    7:12 AM 03/06/2022    8:16 AM 08/01/2021    2:53 PM  CBC  WBC 3.4 - 10.8 x10E3/uL 7.0     Hemoglobin 13.0 - 17.7 g/dL 08/03/2021  09.8  11.9   Hematocrit 37.5 - 51.0 % 45.9  42.5  43.6   Platelets 150 - 450 x10E3/uL 300        Most recent CMP    Latest Ref Rng & Units 05/02/2022    7:12 AM 11/02/2020    8:24 AM 05/30/2019    8:06 AM  CMP  Glucose 70 - 99 mg/dL 06/01/2019  829  562   BUN 6 - 24 mg/dL 14  17  13    Creatinine 0.76 - 1.27 mg/dL 130   8.65   Sodium 134 - 144 mmol/L 141  140  137   Potassium 3.5 - 5.2 mmol/L 4.8  5.2  4.4   Chloride 96 - 106 mmol/L 105  102  103   CO2 20 - 29 mmol/L 22  26  22    Calcium 8.7 - 10.2 mg/dL 8.8  9.2  8.8   Total Protein 6.0 - 8.5 g/dL 6.4  7.0    Total Bilirubin 0.0 - 1.2 mg/dL 0.3  0.5    Alkaline Phos 44 - 121 IU/L 86  109    AST 0 - 40 IU/L 20  19    ALT 0 - 44 IU/L 24  23      Renal function CrCl cannot be calculated (Patient's most recent lab result is older than the maximum 21 days allowed.).  Hgb A1c MFr Bld (%)  Date Value  05/02/2022 5.7 (H)    LDL Chol Calc (NIH)  Date Value Ref Range Status  05/02/2022 106 (H) 0 - 99 mg/dL Final     Venous reflux study performed today in clinic shows prior greater saphenous vein ablation and deep venous reflux  . 05/04/2022, MD [facility] Phone Number: [phone] 06/24/2022 12:03 PM  Total time spent on preparing this encounter including chart review, data review, collecting history, examining the patient, coordinating care for this new patient, 60 minutes.  Portions of this report may have been transcribed using voice recognition software.  Every effort has been made to ensure accuracy; however, inadvertent computerized transcription errors may still be present.

## 2022-06-24 ENCOUNTER — Encounter: Payer: Self-pay | Admitting: Vascular Surgery

## 2022-06-24 ENCOUNTER — Ambulatory Visit: Payer: BC Managed Care – PPO | Admitting: Vascular Surgery

## 2022-06-24 ENCOUNTER — Ambulatory Visit (HOSPITAL_COMMUNITY)
Admission: RE | Admit: 2022-06-24 | Discharge: 2022-06-24 | Disposition: A | Discharge status: Home / Self Care | Payer: BC Managed Care – PPO | Source: Ambulatory Visit | Attending: Vascular Surgery | Admitting: Vascular Surgery

## 2022-06-24 VITALS — BP 109/68 | HR 60 | Temp 98.4°F | Resp 20 | Ht 77.0 in | Wt 300.0 lb

## 2022-06-24 DIAGNOSIS — I872 Venous insufficiency (chronic) (peripheral): Secondary | ICD-10-CM | POA: Diagnosis not present

## 2022-06-24 DIAGNOSIS — I8311 Varicose veins of right lower extremity with inflammation: Secondary | ICD-10-CM | POA: Diagnosis present

## 2022-06-24 DIAGNOSIS — I8312 Varicose veins of left lower extremity with inflammation: Secondary | ICD-10-CM | POA: Diagnosis present

## 2022-07-12 ENCOUNTER — Other Ambulatory Visit: Payer: Self-pay | Admitting: Nurse Practitioner

## 2022-07-12 DIAGNOSIS — M064 Inflammatory polyarthropathy: Secondary | ICD-10-CM

## 2022-08-15 ENCOUNTER — Other Ambulatory Visit: Payer: Self-pay | Admitting: Nurse Practitioner

## 2022-08-15 DIAGNOSIS — M064 Inflammatory polyarthropathy: Secondary | ICD-10-CM

## 2022-08-25 ENCOUNTER — Other Ambulatory Visit: Payer: BC Managed Care – PPO

## 2022-08-27 ENCOUNTER — Ambulatory Visit: Payer: BC Managed Care – PPO | Admitting: Urology

## 2022-09-04 ENCOUNTER — Other Ambulatory Visit: Payer: Self-pay | Admitting: Family Medicine

## 2022-09-04 DIAGNOSIS — N401 Enlarged prostate with lower urinary tract symptoms: Secondary | ICD-10-CM

## 2022-09-04 DIAGNOSIS — E349 Endocrine disorder, unspecified: Secondary | ICD-10-CM

## 2022-09-10 ENCOUNTER — Other Ambulatory Visit: Payer: BC Managed Care – PPO

## 2022-09-15 ENCOUNTER — Encounter (INDEPENDENT_AMBULATORY_CARE_PROVIDER_SITE_OTHER): Payer: Self-pay

## 2022-09-17 ENCOUNTER — Ambulatory Visit: Payer: BC Managed Care – PPO | Admitting: Urology

## 2022-09-23 ENCOUNTER — Other Ambulatory Visit: Payer: BC Managed Care – PPO

## 2022-09-23 DIAGNOSIS — N138 Other obstructive and reflux uropathy: Secondary | ICD-10-CM

## 2022-09-23 DIAGNOSIS — E349 Endocrine disorder, unspecified: Secondary | ICD-10-CM

## 2022-09-24 LAB — PSA: Prostate Specific Ag, Serum: 0.6 ng/mL (ref 0.0–4.0)

## 2022-09-24 LAB — HEMOGLOBIN: Hemoglobin: 14.6 g/dL (ref 13.0–17.7)

## 2022-09-24 LAB — TESTOSTERONE: Testosterone: 586 ng/dL (ref 264–916)

## 2022-09-24 LAB — HEMATOCRIT: Hematocrit: 41.9 % (ref 37.5–51.0)

## 2022-09-25 ENCOUNTER — Ambulatory Visit (INDEPENDENT_AMBULATORY_CARE_PROVIDER_SITE_OTHER): Payer: BC Managed Care – PPO | Admitting: Urology

## 2022-09-25 ENCOUNTER — Ambulatory Visit: Payer: BC Managed Care – PPO | Admitting: Urology

## 2022-09-25 VITALS — BP 134/79 | HR 81 | Ht 76.0 in | Wt 300.0 lb

## 2022-09-25 DIAGNOSIS — E349 Endocrine disorder, unspecified: Secondary | ICD-10-CM

## 2022-09-25 DIAGNOSIS — N138 Other obstructive and reflux uropathy: Secondary | ICD-10-CM

## 2022-09-25 DIAGNOSIS — E291 Testicular hypofunction: Secondary | ICD-10-CM

## 2022-09-25 DIAGNOSIS — N401 Enlarged prostate with lower urinary tract symptoms: Secondary | ICD-10-CM

## 2022-09-25 DIAGNOSIS — N529 Male erectile dysfunction, unspecified: Secondary | ICD-10-CM | POA: Diagnosis not present

## 2022-09-25 MED ORDER — TESTOSTERONE CYPIONATE 200 MG/ML IM SOLN: INTRAMUSCULAR | 0 refills | Status: DC

## 2022-09-25 NOTE — Progress Notes (Signed)
09/28/22 2:13 PM   Robert Trevino 1971-06-15 [id number]  Referring provider:  Lyndon Code, MD [address] [place],  [place] [address]  Chief Complaint  Patient presents with   Benign Prostatic Hypertrophy   Hypogonadism   Urological history: 1.  Testosterone deficiency -contributing factors of age and obesity -testosterone level (09/2022) 586 -hemoglobin/hematocrit (09/2022) 14.6/41.9 -testosterone cypionate 200 mg/mL, 0.75 cc every 2 weeks (on Sunday)   2. ED -Contributing factors of age, BPH, testosterone deficiency, and sleep apnea (for which he sleeps with a CPAP machine) -SHIM 17 -Managed with tadalafil 5 mg, on-demand dosing   3. BPH with LU TS -PSA (09/2022) 0.6 -IPSS 4/1   HPI: Robert Trevino is a 51 y.o.male who presents today for follow-up with blood work,  IPSS and SHIM.   He has no urinary complaints.  Patient denies any modifying or aggravating factors.  Patient denies any gross hematuria, dysuria or suprapubic/flank pain.  Patient denies any fevers, chills, nausea or vomiting.     IPSS     Row Name 09/25/22 1500         International Prostate Symptom Score   How often have you had the sensation of not emptying your bladder? Not at All     How often have you had to urinate less than every two hours? Less than 1 in 5 times     How often have you found you stopped and started again several times when you urinated? Less than 1 in 5 times     How often have you found it difficult to postpone urination? Less than 1 in 5 times     How often have you had a weak urinary stream? Not at All     How often have you had to strain to start urination? Not at All     How many times did you typically get up at night to urinate? 1 Time     Total IPSS Score 4       Quality of Life due to urinary symptoms   If you were to spend the rest of your life with your urinary condition just the way it is now how would you feel about that? Pleased              Score:  1-7  Mild 8-19 Moderate 20-35 Severe  Patient still having spontaneous erections.  He denies any pain or curvature with erections.  He is taking tadalafil 5 mg daily.    SHIM     Row Name 09/25/22 1529         SHIM: Over the last 6 months:   How do you rate your confidence that you could get and keep an erection? Moderate     When you had erections with sexual stimulation, how often were your erections hard enough for penetration (entering your partner)? Sometimes (about half the time)     During sexual intercourse, how often were you able to maintain your erection after you had penetrated (entered) your partner? Most Times (much more than half the time)     During sexual intercourse, how difficult was it to maintain your erection to completion of intercourse? Slightly Difficult     When you attempted sexual intercourse, how often was it satisfactory for you? Sometimes (about half the time)       SHIM Total Score   SHIM 17                PMH: Past  Medical History:  Diagnosis Date   Low testosterone    Sleep apnea     Surgical History: Past Surgical History:  Procedure Laterality Date   NO PAST SURGERIES     verified with patient 10/22/17    Home Medications:  Allergies as of 09/25/2022   No Known Allergies      Medication List        Accurate as of September 25, 2022 11:59 PM. If you have any questions, ask your nurse or doctor.          celecoxib 200 MG capsule Commonly known as: CELEBREX TAKE 1 CAPSULE BY MOUTH TWICE A DAY AS NEEDED   fluticasone 50 MCG/ACT nasal spray Commonly known as: FLONASE Place 2 sprays into both nostrils as needed.   rosuvastatin 20 MG tablet Commonly known as: CRESTOR   tadalafil 5 MG tablet Commonly known as: CIALIS TAKE ONE TABLET BY MOUTH DAILY AS NEEDED FOR ERECTILE DYSFUNCTION   testosterone cypionate 200 MG/ML injection Commonly known as: DEPOTESTOSTERONE CYPIONATE INJECT 0.75ML INTO THE MUSCLE EVERY 2 WEEKS.  DISCARD REMAINDER OF VIAL   triamcinolone 0.025 % cream Commonly known as: KENALOG Apply 1 application  topically 2 (two) times daily.        Allergies:  No Known Allergies  Family History: Family History  Problem Relation Age of Onset   Stroke Father    Diabetes Mother    Healthy Daughter    Kidney disease Neg Hx    Prostate cancer Neg Hx    Kidney cancer Neg Hx    Bladder Cancer Neg Hx     Social History:  reports that he has never smoked. He has never used smokeless tobacco. He reports current alcohol use. He reports that he does not use drugs.   Physical Exam: BP 134/79   Pulse 81   Ht 6\' 4"  (1.93 m)   Wt 300 lb (136.1 kg)   BMI 36.52 kg/m   Constitutional:  Well nourished. Alert and oriented, No acute distress. HEENT:  AT, moist mucus membranes.  Trachea midline Cardiovascular: No clubbing, cyanosis, or edema. Respiratory: Normal respiratory effort, no increased work of breathing. Neurologic: Grossly intact, no focal deficits, moving all 4 extremities. Psychiatric: Normal mood and affect.   Laboratory Data: Lab Results  Component Value Date   CREATININE 1.02 05/02/2022   Component     Latest Ref Rng 09/23/2022  Testosterone     264 - 916 ng/dL 13/05/2022     Component     Latest Ref Rng 09/23/2022  Hemoglobin     13.0 - 17.7 g/dL 13/05/2022     Component     Latest Ref Rng 09/23/2022  HCT     37.5 - 51.0 % 41.9     Component     Latest Ref Rng 09/23/2022  Prostate Specific Ag, Serum     0.0 - 4.0 ng/mL 0.6    Lab Results  Component Value Date   HGBA1C 5.7 (H) 05/02/2022  I have reviewed the labs.   Pertinent Imaging: N/A  Assessment & Plan:    Testosterone deficiency  - At therapeutic levels  - continue testosterone cypionate 200 mg/ml, 0.75 cc IM every two weeks -follow up labs in 3 months  Erectile dysfunction  - Continue Cialis 5 mg daily; refill sent   3. BPH with LUTS -PSA stable -continue conservative management, avoiding bladder  irritants and timed voiding's  Return in about 3 months (around 12/26/2022) for testosterone (one week after injection)  H & H.  And in one year for office visit, PSA, H & H, testosterone,  I PSS and SHIM   Michiel Cowboy, PA-C   [facility] [address], [address] [place], [place] [address] [phone]

## 2022-09-28 ENCOUNTER — Encounter: Payer: Self-pay | Admitting: Urology

## 2022-10-28 ENCOUNTER — Other Ambulatory Visit: Payer: Self-pay | Admitting: Nurse Practitioner

## 2022-10-28 DIAGNOSIS — M064 Inflammatory polyarthropathy: Secondary | ICD-10-CM

## 2022-11-28 ENCOUNTER — Other Ambulatory Visit: Payer: Self-pay | Admitting: Nurse Practitioner

## 2022-11-28 DIAGNOSIS — M064 Inflammatory polyarthropathy: Secondary | ICD-10-CM

## 2023-01-13 ENCOUNTER — Other Ambulatory Visit: Payer: Self-pay | Admitting: Nurse Practitioner

## 2023-01-13 DIAGNOSIS — M064 Inflammatory polyarthropathy: Secondary | ICD-10-CM

## 2023-01-14 MED ORDER — CELECOXIB 200 MG PO CAPS: ORAL_CAPSULE | ORAL | 2 refills | Status: DC

## 2023-01-14 NOTE — Telephone Encounter (Signed)
Pt is now scheduled 02/16/23.

## 2023-01-14 NOTE — Telephone Encounter (Signed)
Refill sent.

## 2023-01-14 NOTE — Telephone Encounter (Signed)
L.O.V: 05/27/22  N.O.V: Not scheduled   L.R.F: 12/212/23 Celecoxib 60 cap 0 refill   OV required

## 2023-02-16 ENCOUNTER — Ambulatory Visit: Payer: BC Managed Care – PPO | Admitting: Nurse Practitioner

## 2023-02-16 ENCOUNTER — Encounter: Payer: Self-pay | Admitting: Nurse Practitioner

## 2023-02-16 VITALS — BP 113/70 | HR 66 | Ht 76.0 in | Wt 303.8 lb

## 2023-02-16 DIAGNOSIS — R5383 Other fatigue: Secondary | ICD-10-CM

## 2023-02-16 DIAGNOSIS — E782 Mixed hyperlipidemia: Secondary | ICD-10-CM | POA: Diagnosis not present

## 2023-02-16 DIAGNOSIS — Z23 Encounter for immunization: Secondary | ICD-10-CM

## 2023-02-16 DIAGNOSIS — R7301 Impaired fasting glucose: Secondary | ICD-10-CM

## 2023-02-16 DIAGNOSIS — I8311 Varicose veins of right lower extremity with inflammation: Secondary | ICD-10-CM

## 2023-02-16 DIAGNOSIS — Z125 Encounter for screening for malignant neoplasm of prostate: Secondary | ICD-10-CM

## 2023-02-16 DIAGNOSIS — I8312 Varicose veins of left lower extremity with inflammation: Secondary | ICD-10-CM

## 2023-02-16 DIAGNOSIS — M064 Inflammatory polyarthropathy: Secondary | ICD-10-CM | POA: Diagnosis not present

## 2023-02-16 MED ORDER — CELECOXIB 200 MG PO CAPS: ORAL_CAPSULE | ORAL | 3 refills | Status: DC

## 2023-02-16 NOTE — Progress Notes (Signed)
Established patient visit   Patient: Robert Trevino   DOB: 05/22/1971   52 y.o. Male  MRN: [id number] Visit Date: 02/16/2023   Chief Complaint  Patient presents with   Medical Management of Chronic Issues   Subjective    HPI  Follow up  -chronic lower leg pain -takes celebrex twice daily when needed -needs a new prescripton.  -due to have routine, fasting labs -He denies chest pain, chest pressure, or shortness of breath. He denies headaches or visual disturbances. He denies abdominal pain, nausea, vomiting, or changes in bowel or bladder habits.     Medications: Outpatient Medications Prior to Visit  Medication Sig   fluticasone (FLONASE) 50 MCG/ACT nasal spray Place 2 sprays into both nostrils as needed.    rosuvastatin (CRESTOR) 20 MG tablet    tadalafil (CIALIS) 5 MG tablet TAKE ONE TABLET BY MOUTH DAILY AS NEEDED FOR ERECTILE DYSFUNCTION   testosterone cypionate (DEPOTESTOSTERONE CYPIONATE) 200 MG/ML injection INJECT 0.75ML INTO THE MUSCLE EVERY 2 WEEKS. DISCARD REMAINDER OF VIAL   triamcinolone (KENALOG) 0.025 % cream Apply 1 application  topically 2 (two) times daily.   [DISCONTINUED] celecoxib (CELEBREX) 200 MG capsule TAKE 1 CAPSULE BY MOUTH TWICE A DAY AS NEEDED   No facility-administered medications prior to visit.    Review of Systems See HPI    Last CBC Lab Results  Component Value Date   WBC 7.0 05/02/2022   HGB 14.6 09/23/2022   HCT 41.9 09/23/2022   MCV 91 05/02/2022   MCH 31.2 05/02/2022   RDW 12.6 05/02/2022   PLT 300 05/02/2022   Last metabolic panel Lab Results  Component Value Date   GLUCOSE 109 (H) 05/02/2022   NA 141 05/02/2022   K 4.8 05/02/2022   CL 105 05/02/2022   CO2 22 05/02/2022   BUN 14 05/02/2022   CREATININE 1.02 05/02/2022   EGFR 90 05/02/2022   CALCIUM 8.8 05/02/2022   PROT 6.4 05/02/2022   ALBUMIN 4.2 05/02/2022   LABGLOB 2.2 05/02/2022   AGRATIO 1.9 05/02/2022   BILITOT 0.3 05/02/2022   ALKPHOS 86 05/02/2022   AST 20  05/02/2022   ALT 24 05/02/2022   Last lipids Lab Results  Component Value Date   CHOL 159 05/02/2022   HDL 34 (L) 05/02/2022   LDLCALC 106 (H) 05/02/2022   TRIG 103 05/02/2022   CHOLHDL 4.7 05/02/2022   Last hemoglobin A1c Lab Results  Component Value Date   HGBA1C 5.7 (H) 05/02/2022   Last thyroid functions Lab Results  Component Value Date   TSH 2.090 05/02/2022   T3TOTAL 147 05/30/2019        Objective     Today's Vitals   02/16/23 1535  BP: 113/70  Pulse: 66  SpO2: 95%  Weight: (Abnormal) 303 lb 12.8 oz (137.8 kg)  Height: 6\' 4"  (1.93 m)   Body mass index is 36.98 kg/m.  BP Readings from Last 3 Encounters:  02/16/23 113/70  09/25/22 134/79  06/24/22 109/68    Wt Readings from Last 3 Encounters:  02/16/23 (Abnormal) 303 lb 12.8 oz (137.8 kg)  09/25/22 300 lb (136.1 kg)  06/24/22 300 lb (136.1 kg)    Physical Exam Vitals and nursing note reviewed.  Constitutional:      Appearance: Normal appearance. He is well-developed.  HENT:     Head: Normocephalic and atraumatic.     Right Ear: Tympanic membrane, ear canal and external ear normal.     Left Ear: Tympanic membrane, ear canal and  external ear normal.     Nose: Nose normal.     Mouth/Throat:     Mouth: Mucous membranes are moist.     Pharynx: Oropharynx is clear.  Eyes:     Extraocular Movements: Extraocular movements intact.     Conjunctiva/sclera: Conjunctivae normal.     Pupils: Pupils are equal, round, and reactive to light.  Neck:     Vascular: No carotid bruit.  Cardiovascular:     Rate and Rhythm: Normal rate and regular rhythm.     Pulses: Normal pulses.     Heart sounds: Normal heart sounds.     Comments: : Varicose veins with evidence of inflammation of bilateral lower legs.  Pulmonary:     Effort: Pulmonary effort is normal.     Breath sounds: Normal breath sounds.  Abdominal:     General: Bowel sounds are normal. There is no distension.     Palpations: Abdomen is soft. There  is no mass.     Tenderness: There is no abdominal tenderness. There is no right CVA tenderness, left CVA tenderness, guarding or rebound.     Hernia: No hernia is present.  Musculoskeletal:        General: Normal range of motion.     Cervical back: Normal range of motion and neck supple.  Lymphadenopathy:     Cervical: No cervical adenopathy.  Skin:    General: Skin is warm and dry.     Capillary Refill: Capillary refill takes less than 2 seconds.     Findings: Erythema present.     Comments: Moderate erythema and itching of bilateral lower extremities   Neurological:     General: No focal deficit present.     Mental Status: He is alert and oriented to person, place, and time.  Psychiatric:        Mood and Affect: Mood normal.        Behavior: Behavior normal.        Thought Content: Thought content normal.        Judgment: Judgment normal.     Assessment & Plan    Inflammatory polyarthropathy (HCC) Assessment & Plan: Continue to take celebrex 200 mg twice daily as needed for pain/inflammatory. Refills sent to pharmacy today   Orders: -     Celecoxib; TAKE 1 CAPSULE BY MOUTH TWICE A DAY AS NEEDED  Dispense: 60 capsule; Refill: 3  Varicose veins of both lower extremities with inflammation Assessment & Plan: Continue to use triamcinolone cream twice daily when needed    Other fatigue Assessment & Plan: Check labs for further evaluation.   Orders: -     CBC with Differential/Platelet; Future -     Comprehensive metabolic panel; Future -     TSH + free T4; Future  Mixed hyperlipidemia Assessment & Plan: Check fasting lipid panel and adjust statin treatment as indicated.   Orders: -     CBC with Differential/Platelet; Future -     Comprehensive metabolic panel; Future -     Lipid panel; Future  Impaired fasting glucose Assessment & Plan: Check labs including HgbA1c for further evaluation.   Orders: -     Hemoglobin A1c; Future  Need for Tdap vaccination -      Tdap vaccine greater than or equal to 7yo IM  Screening for prostate cancer -     PSA; Future     Return in about 4 months (around 06/18/2023) for health maintenance exam.  Ronnell Freshwater, NP  [facility] [phone] (phone) [phone] (fax)  [place]

## 2023-03-19 NOTE — Assessment & Plan Note (Signed)
Check fasting lipid panel and adjust statin treatment as indicated.

## 2023-03-19 NOTE — Assessment & Plan Note (Signed)
Check labs including HgbA1c for further evaluation.

## 2023-03-19 NOTE — Assessment & Plan Note (Signed)
Continue to take celebrex 200 mg twice daily as needed for pain/inflammatory. Refills sent to pharmacy today

## 2023-03-19 NOTE — Assessment & Plan Note (Signed)
Continue to use triamcinolone cream twice daily when needed  

## 2023-03-19 NOTE — Assessment & Plan Note (Deleted)
Continue to use triamcinolone cream twice daily when needed

## 2023-03-19 NOTE — Assessment & Plan Note (Signed)
Check labs for further evaluation.  

## 2023-03-23 ENCOUNTER — Other Ambulatory Visit: Payer: Self-pay

## 2023-03-23 DIAGNOSIS — N401 Enlarged prostate with lower urinary tract symptoms: Secondary | ICD-10-CM

## 2023-03-23 DIAGNOSIS — E349 Endocrine disorder, unspecified: Secondary | ICD-10-CM

## 2023-03-24 ENCOUNTER — Other Ambulatory Visit: Payer: BC Managed Care – PPO

## 2023-03-24 DIAGNOSIS — E349 Endocrine disorder, unspecified: Secondary | ICD-10-CM

## 2023-03-24 DIAGNOSIS — N138 Other obstructive and reflux uropathy: Secondary | ICD-10-CM

## 2023-03-24 DIAGNOSIS — N401 Enlarged prostate with lower urinary tract symptoms: Secondary | ICD-10-CM

## 2023-03-25 ENCOUNTER — Telehealth: Payer: Self-pay | Admitting: Family Medicine

## 2023-03-25 DIAGNOSIS — E349 Endocrine disorder, unspecified: Secondary | ICD-10-CM

## 2023-03-25 LAB — HEMOGLOBIN AND HEMATOCRIT, BLOOD
Hematocrit: 45.7 % (ref 37.5–51.0)
Hemoglobin: 15.6 g/dL (ref 13.0–17.7)

## 2023-03-25 LAB — PSA: Prostate Specific Ag, Serum: 0.7 ng/mL (ref 0.0–4.0)

## 2023-03-25 LAB — TESTOSTERONE: Testosterone: 1145 ng/dL — ABNORMAL HIGH (ref 264–916)

## 2023-03-25 NOTE — Telephone Encounter (Signed)
-----   Message from Harle Battiest, PA-C sent at 03/25/2023  9:27 AM EDT ----- His testosterone was a bit elevated, was his blood work drawn just after he injected himself?

## 2023-03-25 NOTE — Telephone Encounter (Signed)
Spoke to patient and he states he gave his injection late. He injected on Thursday and so it was not at the mid. Patient will return in 3 months for labs at mid week.

## 2023-05-25 ENCOUNTER — Other Ambulatory Visit: Payer: Self-pay | Admitting: Urology

## 2023-05-25 DIAGNOSIS — E349 Endocrine disorder, unspecified: Secondary | ICD-10-CM

## 2023-06-26 ENCOUNTER — Other Ambulatory Visit: Payer: Self-pay | Admitting: Urology

## 2023-06-26 DIAGNOSIS — E349 Endocrine disorder, unspecified: Secondary | ICD-10-CM

## 2023-06-26 MED ORDER — TESTOSTERONE CYPIONATE 200 MG/ML IM SOLN
INTRAMUSCULAR | 0 refills | Status: DC
Start: 2023-06-26 — End: 2024-01-13

## 2023-07-02 ENCOUNTER — Other Ambulatory Visit: Payer: BC Managed Care – PPO

## 2023-07-02 DIAGNOSIS — E349 Endocrine disorder, unspecified: Secondary | ICD-10-CM

## 2023-07-03 LAB — HEMOGLOBIN: Hemoglobin: 15.3 g/dL (ref 13.0–17.7)

## 2023-07-03 LAB — TESTOSTERONE: Testosterone: 473 ng/dL (ref 264–916)

## 2023-07-03 LAB — HEMATOCRIT: Hematocrit: 44.1 % (ref 37.5–51.0)

## 2023-08-27 ENCOUNTER — Ambulatory Visit: Payer: BC Managed Care – PPO | Admitting: Nurse Practitioner

## 2023-08-27 ENCOUNTER — Encounter: Payer: Self-pay | Admitting: Nurse Practitioner

## 2023-08-27 ENCOUNTER — Telehealth: Payer: Self-pay

## 2023-08-27 VITALS — BP 132/80 | HR 74 | Temp 98.0°F | Ht 75.25 in | Wt 292.4 lb

## 2023-08-27 DIAGNOSIS — Z125 Encounter for screening for malignant neoplasm of prostate: Secondary | ICD-10-CM

## 2023-08-27 DIAGNOSIS — Z0000 Encounter for general adult medical examination without abnormal findings: Secondary | ICD-10-CM | POA: Diagnosis not present

## 2023-08-27 DIAGNOSIS — L209 Atopic dermatitis, unspecified: Secondary | ICD-10-CM | POA: Diagnosis not present

## 2023-08-27 DIAGNOSIS — M064 Inflammatory polyarthropathy: Secondary | ICD-10-CM

## 2023-08-27 DIAGNOSIS — I872 Venous insufficiency (chronic) (peripheral): Secondary | ICD-10-CM

## 2023-08-27 DIAGNOSIS — E291 Testicular hypofunction: Secondary | ICD-10-CM | POA: Diagnosis not present

## 2023-08-27 DIAGNOSIS — Z1211 Encounter for screening for malignant neoplasm of colon: Secondary | ICD-10-CM

## 2023-08-27 DIAGNOSIS — E669 Obesity, unspecified: Secondary | ICD-10-CM

## 2023-08-27 DIAGNOSIS — Z13220 Encounter for screening for lipoid disorders: Secondary | ICD-10-CM

## 2023-08-27 MED ORDER — TRIAMCINOLONE ACETONIDE 0.025 % EX CREA
1.0000 | TOPICAL_CREAM | Freq: Two times a day (BID) | CUTANEOUS | 0 refills | Status: DC
Start: 2023-08-27 — End: 2024-08-29

## 2023-08-27 NOTE — Assessment & Plan Note (Signed)
History of the same of arthritis of multiple joints patient does Celebrex 200 mg twice daily.  Continue medication as prescribed pending labs today advised patient to avoid other NSAIDs

## 2023-08-27 NOTE — Patient Instructions (Signed)
Nice to see you today I will be in touch with the labs once I have them Follow up with me in 1 year, sooner if you need me   

## 2023-08-27 NOTE — Assessment & Plan Note (Signed)
History of same was followed by [facility] urology.  Currently on testosterone injections at home continue medication as prescribed follow-up with specialty as recommended

## 2023-08-27 NOTE — Progress Notes (Signed)
New Patient Office Visit  Subjective    Patient ID: Robert Trevino, male    DOB: 1971-04-16  Age: 52 y.o. MRN: [id number]  CC:  Chief Complaint  Patient presents with   Establish Care   Annual Exam    Has work form needing CPE by 09/17/23    HPI [name] presents to establish care   Varicose veins: has been seen by vascular he will use the steroid cream to help with itching  Polyarthropathy: hands feet and other parts. 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 He does celebrex 200mg  BID and does help   Hypogonadism: urology, followed year   OSA: has a CPAP but does not use it as it should  for complete physical and follow up of chronic conditions.  Immunizations: -Tetanus: Completed in 2024 -Influenza: refused  -Shingles: discussed -Pneumonia: Completed   Diet: Fair diet. He eats breakfast (smaller), lunch and dinner and will snack in between. Coffee in the winter. Water in the summer. Pepsi sometimes and energy drinks. He will do 2   Exercise: No regular exercise. Employment   Eye exam: Completes annually. Glasses   Dental exam: Completes semi-annually    Colonoscopy: Completed in amb refer [place]  Lung Cancer Screening: Completed in   PSA: Due  Sleep: states he goes to bed at varying times 10-11 and gets up a t 445. Does not feel rested. Does snore    Outpatient Encounter Medications as of 08/27/2023  Medication Sig   celecoxib (CELEBREX) 200 MG capsule Take 200 mg by mouth 2 (two) times daily as needed.   fluticasone (FLONASE) 50 MCG/ACT nasal spray Place 2 sprays into both nostrils as needed.    testosterone cypionate (DEPOTESTOSTERONE CYPIONATE) 200 MG/ML injection INJECT 0.75 ML INTO THE MUSCLE EVERY 2 WEEKS. DISCARD REMAINDER OF VIAL.   [DISCONTINUED] triamcinolone (KENALOG) 0.025 % cream Apply 1 application  topically 2 (two) times daily.   triamcinolone (KENALOG) 0.025 % cream Apply 1 Application topically 2 (two) times daily.    [DISCONTINUED] celecoxib (CELEBREX) 200 MG capsule TAKE 1 CAPSULE BY MOUTH TWICE A DAY AS NEEDED   [DISCONTINUED] rosuvastatin (CRESTOR) 20 MG tablet    [DISCONTINUED] tadalafil (CIALIS) 5 MG tablet TAKE ONE TABLET BY MOUTH DAILY AS NEEDED FOR ERECTILE DYSFUNCTION   No facility-administered encounter medications on file as of 08/27/2023.    Past Medical History:  Diagnosis Date   Family history of allergies    History of arthritis    Low testosterone    Sleep apnea     Past Surgical History:  Procedure Laterality Date   NO PAST SURGERIES     verified with patient 10/22/17    Family History  Problem Relation Age of Onset   Early death Mother    Diabetes Mother    Cancer Mother        leukemia   Stroke Father    Healthy Daughter    Kidney disease Neg Hx    Prostate cancer Neg Hx    Kidney cancer Neg Hx    Bladder Cancer Neg Hx     Social History   Socioeconomic History   Marital status: Married    Spouse name: Robert Trevino   Number of children: 1   Years of education: Not on file   Highest education level: Not on file  Occupational History   Not on file  Tobacco Use   Smoking status: Never   Smokeless tobacco: Never  Vaping Use  Vaping status: Never Used  Substance and Sexual Activity   Alcohol use: Yes    Comment: 1-2 Beers Weekly   Drug use: No   Sexual activity: Not on file  Other Topics Concern   Not on file  Social History Narrative   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  Respiratory:  Negative for shortness of breath.   Cardiovascular:  Negative for chest pain and leg swelling.  Gastrointestinal:  Negative for  abdominal pain, blood in stool, constipation, diarrhea, nausea and vomiting.       BM daily   Genitourinary:  Negative for dysuria and hematuria.  Neurological:  Negative for tingling and headaches.  Psychiatric/Behavioral:  Negative for hallucinations and suicidal ideas.         Objective    BP 132/80 (BP Location: Left Arm, Patient Position: Sitting, Cuff Size: Large)   Pulse 74   Temp 98 F (36.7 C) (Oral)   Ht 6' 3.25" (1.911 m)   Wt 292 lb 6 oz (132.6 kg)   SpO2 95%   BMI 36.30 kg/m   Physical Exam Vitals and nursing note reviewed.  Constitutional:      Appearance: Normal appearance.  HENT:     Right Ear: Tympanic membrane, ear canal and external ear normal.     Left Ear: Tympanic membrane, ear canal and external ear normal.     Mouth/Throat:     Mouth: Mucous membranes are moist.     Pharynx: Oropharynx is clear.  Eyes:     Extraocular Movements: Extraocular movements intact.     Pupils: Pupils are equal, round, and reactive to light.  Cardiovascular:     Rate and Rhythm: Normal rate and regular rhythm.     Pulses: Normal pulses.     Heart sounds: Normal heart sounds.  Pulmonary:     Effort: Pulmonary effort is normal.     Breath sounds: Normal breath sounds.  Abdominal:     General: Bowel sounds are normal. There is no distension.     Palpations: There is no mass.     Tenderness: There is no abdominal tenderness.     Hernia: No hernia is present.  Musculoskeletal:     Right lower leg: Edema present.     Left lower leg: Edema present.  Lymphadenopathy:     Cervical: No cervical adenopathy.  Skin:    General: Skin is warm.     Findings: Lesion present.     Comments: Ruddy purple discoloration in spots on the lower extremities   Neurological:     General: No focal deficit present.     Mental Status: He is alert.     Deep Tendon Reflexes:     Reflex Scores:      Bicep reflexes are 2+ on the right side and 2+ on the left side.      Patellar reflexes are  2+ on the right side and 2+ on the left side.    Comments: Bilateral upper and lower extremity strength 5/5  Psychiatric:        Mood and Affect: Mood normal.  Behavior: Behavior normal.        Thought Content: Thought content normal.        Judgment: Judgment normal.         Assessment & Plan:   Problem List Items Addressed This Visit       Cardiovascular and Mediastinum   Venous stasis dermatitis of both lower extremities    History of the same has been evaluated by vascular in the past.  Uses triamcinolone sparingly.  Topical steroid precautions reviewed with patient refill provided today        Endocrine   Hypogonadism in male    History of same was followed by [facility] urology.  Currently on testosterone injections at home continue medication as prescribed follow-up with specialty as recommended        Musculoskeletal and Integument   Atopic dermatitis   Relevant Medications   triamcinolone (KENALOG) 0.025 % cream   Inflammatory polyarthropathy (HCC)    History of the same of arthritis of multiple joints patient does Celebrex 200 mg twice daily.  Continue medication as prescribed pending labs today advised patient to avoid other NSAIDs      Relevant Medications   celecoxib (CELEBREX) 200 MG capsule     Other   Preventative health care - Primary    Discussed age-appropriate immunizations and screening exams.  Did review patient's personal, surgical, social, family histories.  Did review age-appropriate immunizations with patient and he is up-to-date on all immunizations he would like.  We did discuss shingles vaccine in office.  Ambulatory referral to GI for CRC screening.  Prostate specific antigen for prostate cancer screening today.  Patient was given information at discharge about preventative healthcare maintenance with anticipatory guidance.  Patient had a form for work to be filled out to [place] that he got his physical completed.  Fill this form out a copy  retained for office to be scanned to chart.      Relevant Orders   CBC   Comprehensive metabolic panel   TSH   Obesity (BMI 30-39.9)    Pending TSH, A1c, lipid panel.      Relevant Orders   Hemoglobin A1c   Other Visit Diagnoses     Screening for prostate cancer       Relevant Orders   PSA   Screening for colon cancer       Relevant Orders   Ambulatory referral to Gastroenterology   Screening for lipid disorders       Relevant Orders   Lipid panel       Return in about 1 year (around 08/26/2024) for CPE and Labs.   Audria Nine, NP

## 2023-08-27 NOTE — Telephone Encounter (Signed)
Patient has requested to schedule his colonoscopy sometime in the new year.  Informed him that I would put a reminder in to call him in January to schedule and send mychart message for his reminder.  Thanks,  [place], [place]

## 2023-08-27 NOTE — Assessment & Plan Note (Signed)
History of the same has been evaluated by vascular in the past.  Uses triamcinolone sparingly.  Topical steroid precautions reviewed with patient refill provided today

## 2023-08-27 NOTE — Assessment & Plan Note (Signed)
Discussed age-appropriate immunizations and screening exams.  Did review patient's personal, surgical, social, family histories.  Did review age-appropriate immunizations with patient and he is up-to-date on all immunizations he would like.  We did discuss shingles vaccine in office.  Ambulatory referral to GI for CRC screening.  Prostate specific antigen for prostate cancer screening today.  Patient was given information at discharge about preventative healthcare maintenance with anticipatory guidance.  Patient had a form for work to be filled out to [place] that he got his physical completed.  Fill this form out a copy retained for office to be scanned to chart.

## 2023-08-27 NOTE — Assessment & Plan Note (Signed)
Pending TSH, A1c, lipid panel.

## 2023-08-28 LAB — HEMOGLOBIN A1C
Est. average glucose Bld gHb Est-mCnc: 123 mg/dL
Hgb A1c MFr Bld: 5.9 % — ABNORMAL HIGH (ref 4.8–5.6)

## 2023-08-28 LAB — CBC
Hematocrit: 43.8 % (ref 37.5–51.0)
Hemoglobin: 14.8 g/dL (ref 13.0–17.7)
MCH: 31.4 pg (ref 26.6–33.0)
MCHC: 33.8 g/dL (ref 31.5–35.7)
MCV: 93 fL (ref 79–97)
Platelets: 347 10*3/uL (ref 150–450)
RBC: 4.72 x10E6/uL (ref 4.14–5.80)
RDW: 11.8 % (ref 11.6–15.4)
WBC: 7.4 10*3/uL (ref 3.4–10.8)

## 2023-08-28 LAB — COMPREHENSIVE METABOLIC PANEL
ALT: 32 [IU]/L (ref 0–44)
AST: 26 [IU]/L (ref 0–40)
Albumin: 4.4 g/dL (ref 4.1–5.1)
Alkaline Phosphatase: 108 [IU]/L (ref 44–121)
BUN/Creatinine Ratio: 15 (ref 9–20)
BUN: 14 mg/dL (ref 6–24)
Bilirubin Total: 0.2 mg/dL (ref 0.0–1.2)
CO2: 25 mmol/L (ref 20–29)
Calcium: 9.2 mg/dL (ref 8.7–10.2)
Chloride: 104 mmol/L (ref 96–106)
Creatinine, Ser: 0.92 mg/dL (ref 0.76–1.27)
Globulin, Total: 2.3 g/dL (ref 1.5–4.5)
Glucose: 89 mg/dL (ref 70–99)
Potassium: 4.7 mmol/L (ref 3.5–5.2)
Sodium: 141 mmol/L (ref 134–144)
Total Protein: 6.7 g/dL (ref 6.0–8.5)
eGFR: 101 mL/min/{1.73_m2} (ref >59)

## 2023-08-28 LAB — LIPID PANEL
Chol/HDL Ratio: 4.1 {ratio} (ref 0.0–5.0)
Cholesterol, Total: 162 mg/dL (ref 100–199)
HDL: 40 mg/dL (ref >39)
LDL Chol Calc (NIH): 101 mg/dL — ABNORMAL HIGH (ref 0–99)
Triglycerides: 116 mg/dL (ref 0–149)
VLDL Cholesterol Cal: 21 mg/dL (ref 5–40)

## 2023-08-28 LAB — TSH: TSH: 1.39 u[IU]/mL (ref 0.450–4.500)

## 2023-08-28 LAB — PSA: Prostate Specific Ag, Serum: 0.5 ng/mL (ref 0.0–4.0)

## 2023-09-10 ENCOUNTER — Telehealth: Payer: Self-pay | Admitting: Nurse Practitioner

## 2023-09-10 NOTE — Telephone Encounter (Signed)
Yes Z00.00 and E29.1

## 2023-09-10 NOTE — Telephone Encounter (Signed)
[employer] [place] called stating that code done for CBC lab done on 08/27/23 was denied by insurance because it has an obesity diagnosis code. She was wondering if there is another code that could be sent for this lab to help it get covered by insurance.

## 2023-09-11 NOTE — Telephone Encounter (Signed)
Called insurance to add codes. Was on the phone for over 25 min. Was informed that we would need to resubmit claim with additional codes to be covered. Please help not sure who I should send this to.

## 2023-09-28 NOTE — Progress Notes (Signed)
09/30/23 3:59 PM   [name] Nov 18, 1970 [id number]  Referring provider:  Carlean Jews, NP [address] [place],  [place] [address]  Urological history: 1.  Testosterone deficiency -contributing factors of age and obesity -testosterone level (09/2023) pending -hemoglobin/hematocrit (09/2023) normal  -testosterone cypionate 200 mg/mL, 0.75 cc every 2 weeks (on Sunday)   2. ED -Contributing factors of age, BPH, testosterone deficiency, and sleep apnea (for which he sleeps with a CPAP machine) -Managed with tadalafil 5 mg, on-demand dosing   3. BPH with LU TS -PSA (08/2023) 0.5   HPI: Robert Trevino is a 52 y.o.male who presents today for follow-up with blood work,  IPSS and SHIM.   Previous records reviewed.   I PSS 3/1  No urinary complaints.  Patient denies any modifying or aggravating factors.  Patient denies any recent UTI's, gross hematuria, dysuria or suprapubic/flank pain.  Patient denies any fevers, chills, nausea or vomiting.      IPSS     Row Name 09/30/23 1500         International Prostate Symptom Score   How often have you had the sensation of not emptying your bladder? Not at All     How often have you had to urinate less than every two hours? Less than 1 in 5 times     How often have you found you stopped and started again several times when you urinated? Not at All     How often have you found it difficult to postpone urination? Not at All     How often have you had a weak urinary stream? Less than 1 in 5 times     How often have you had to strain to start urination? Not at All     How many times did you typically get up at night to urinate? 1 Time     Total IPSS Score 3       Quality of Life due to urinary symptoms   If you were to spend the rest of your life with your urinary condition just the way it is now how would you feel about that? Pleased               Score:  1-7 Mild 8-19 Moderate 20-35 Severe  SHIM 20  Patient  still having spontaneous erections.  He denies any pain or curvature with erections.     SHIM     Row Name 09/30/23 1548         SHIM: Over the last 6 months:   How do you rate your confidence that you could get and keep an erection? Moderate     When you had erections with sexual stimulation, how often were your erections hard enough for penetration (entering your partner)? Most Times (much more than half the time)     During sexual intercourse, how often were you able to maintain your erection after you had penetrated (entered) your partner? Most Times (much more than half the time)     During sexual intercourse, how difficult was it to maintain your erection to completion of intercourse? Not Difficult     When you attempted sexual intercourse, how often was it satisfactory for you? Most Times (much more than half the time)       SHIM Total Score   SHIM 20              Score: 1-7 Severe ED 8-11 Moderate ED 12-16 Mild-Moderate ED  17-21 Mild ED 22-25 No ED   PMH: Past Medical History:  Diagnosis Date   Family history of allergies    History of arthritis    Low testosterone    Sleep apnea     Surgical History: Past Surgical History:  Procedure Laterality Date   NO PAST SURGERIES     verified with patient 10/22/17    Home Medications:  Allergies as of 09/30/2023   No Known Allergies      Medication List        Accurate as of September 30, 2023  3:59 PM. If you have any questions, ask your nurse or doctor.          celecoxib 200 MG capsule Commonly known as: CELEBREX Take 200 mg by mouth 2 (two) times daily as needed.   fluticasone 50 MCG/ACT nasal spray Commonly known as: FLONASE Place 2 sprays into both nostrils as needed.   testosterone cypionate 200 MG/ML injection Commonly known as: DEPOTESTOSTERONE CYPIONATE INJECT 0.75 ML INTO THE MUSCLE EVERY 2 WEEKS. DISCARD REMAINDER OF VIAL.   triamcinolone 0.025 % cream Commonly known as:  KENALOG Apply 1 Application topically 2 (two) times daily.        Allergies:  No Known Allergies  Family History: Family History  Problem Relation Age of Onset   Early death Mother    Diabetes Mother    Cancer Mother        leukemia   Stroke Father    Healthy Daughter    Kidney disease Neg Hx    Prostate cancer Neg Hx    Kidney cancer Neg Hx    Bladder Cancer Neg Hx     Social History:  reports that he has never smoked. He has never used smokeless tobacco. He reports current alcohol use. He reports that he does not use drugs.   Physical Exam: BP 120/79   Pulse 70   Constitutional:  Well nourished. Alert and oriented, No acute distress. HEENT: [place] AT, moist mucus membranes.  Trachea midline Cardiovascular: No clubbing, cyanosis, or edema. Respiratory: Normal respiratory effort, no increased work of breathing. GU: No CVA tenderness.  No bladder fullness or masses.  Patient with circumcised phallus.  Urethral meatus is patent.  No penile discharge. No penile lesions or rashes. Scrotum without lesions, cysts, rashes and/or edema.  Testicles are located scrotally bilaterally. No masses are appreciated in the testicles. Left and right epididymis are normal. Rectal: Patient with  normal sphincter tone. Anus and perineum without scarring or rashes. No rectal masses are appreciated. Prostate is approximately 50 grams, could not palpate the entire gland, no nodules are appreciated. Seminal vesicles could not be palpated Neurologic: Grossly intact, no focal deficits, moving all 4 extremities. Psychiatric: Normal mood and affect.   Laboratory Data: Lab Results  Component Value Date   CREATININE 0.92 08/27/2023    Lab Results  Component Value Date   HGBA1C 5.9 (H) 08/27/2023   Lipid Panel     Component Value Date/Time   CHOL 162 08/27/2023 1621   TRIG 116 08/27/2023 1621   HDL 40 08/27/2023 1621   CHOLHDL 4.1 08/27/2023 1621   LDLCALC 101 (H) 08/27/2023 1621   LABVLDL 21  08/27/2023 1621    CMP     Component Value Date/Time   NA 141 08/27/2023 1621   K 4.7 08/27/2023 1621   CL 104 08/27/2023 1621   CO2 25 08/27/2023 1621   GLUCOSE 89 08/27/2023 1621   BUN 14 08/27/2023 1621  CREATININE 0.92 08/27/2023 1621   CALCIUM 9.2 08/27/2023 1621   PROT 6.7 08/27/2023 1621   ALBUMIN 4.4 08/27/2023 1621   AST 26 08/27/2023 1621   ALT 32 08/27/2023 1621   ALKPHOS 108 08/27/2023 1621   BILITOT 0.2 08/27/2023 1621   EGFR 101 08/27/2023 1621   GFRNONAA 80 11/02/2020 0824  I have reviewed the labs.   Pertinent Imaging: N/A  Assessment & Plan:    Testosterone deficiency  -Testosterone levels pending -Hemoglobin/hematocrit levels (08/2023) normal  -continue testosterone cypionate 200 mg/ml, 0.75 cc IM every two weeks  Erectile dysfunction  -mild  -no bothersome symptoms  3. BPH with LUTS -PSA pending  -continue conservative management, avoiding bladder irritants and timed voiding's  Return in about 6 months (around 03/29/2024) for PSA, testosterone (one week after injection) H & H only .   Cloretta Ned   [facility] [address] [place], [place] [address] [phone]

## 2023-09-30 ENCOUNTER — Encounter: Payer: Self-pay | Admitting: Urology

## 2023-09-30 ENCOUNTER — Ambulatory Visit: Payer: BC Managed Care – PPO | Admitting: Urology

## 2023-09-30 VITALS — BP 120/79 | HR 70

## 2023-09-30 DIAGNOSIS — N401 Enlarged prostate with lower urinary tract symptoms: Secondary | ICD-10-CM | POA: Diagnosis not present

## 2023-09-30 DIAGNOSIS — E291 Testicular hypofunction: Secondary | ICD-10-CM

## 2023-09-30 DIAGNOSIS — N529 Male erectile dysfunction, unspecified: Secondary | ICD-10-CM | POA: Diagnosis not present

## 2023-09-30 DIAGNOSIS — N138 Other obstructive and reflux uropathy: Secondary | ICD-10-CM

## 2023-10-01 ENCOUNTER — Other Ambulatory Visit: Payer: Self-pay | Admitting: Urology

## 2023-10-01 DIAGNOSIS — E291 Testicular hypofunction: Secondary | ICD-10-CM

## 2023-10-01 DIAGNOSIS — N138 Other obstructive and reflux uropathy: Secondary | ICD-10-CM

## 2023-10-01 LAB — HEMOGLOBIN AND HEMATOCRIT, BLOOD
Hematocrit: 42.4 % (ref 37.5–51.0)
Hemoglobin: 14.5 g/dL (ref 13.0–17.7)

## 2023-10-01 LAB — TESTOSTERONE: Testosterone: 540 ng/dL (ref 264–916)

## 2023-11-23 NOTE — Telephone Encounter (Signed)
 Patient stated that this month is kinda busy for him at work.  He will call back in Feb to discuss scheduling.  Thanks,  [place], [place]

## 2024-01-11 ENCOUNTER — Other Ambulatory Visit: Payer: Self-pay | Admitting: Urology

## 2024-01-11 DIAGNOSIS — E349 Endocrine disorder, unspecified: Secondary | ICD-10-CM

## 2024-02-08 ENCOUNTER — Other Ambulatory Visit: Payer: Self-pay | Admitting: Nurse Practitioner

## 2024-02-29 ENCOUNTER — Other Ambulatory Visit: Payer: Self-pay | Admitting: Nurse Practitioner

## 2024-02-29 MED ORDER — CELECOXIB 200 MG PO CAPS: 200.0000 mg | ORAL_CAPSULE | Freq: Two times a day (BID) | ORAL | 2 refills | Status: DC | PRN

## 2024-02-29 NOTE — Telephone Encounter (Signed)
 Copied from CRM [id number]. Topic: Clinical - Medication Refill >> Feb 29, 2024  2:28 PM Ivette P wrote: Most Recent Primary Care Visit:  Provider: Dorothe Gaster  Department: [facility]  Visit Type: NEW PATIENT  Date: 08/27/2023  Medication: celecoxib (CELEBREX) 200 MG capsule  Has the patient contacted their pharmacy? Yes (Agent: If no, request that the patient contact the pharmacy for the refill. If patient does not wish to contact the pharmacy document the reason why and proceed with request.) (Agent: If yes, when and what did the pharmacy advise?)  Is this the correct pharmacy for this prescription? Yes If no, delete pharmacy and type the correct one.  This is the patient's preferred pharmacy:  [facility] [id number] [place], [place] - [address] [place] [place] [address] Phone: [phone] Fax: [phone]   Has the prescription been filled recently? No, 08/27/2023  Is the patient out of the medication? Yes, has a few left  Has the patient been seen for an appointment in the last year OR does the patient have an upcoming appointment? Yes  Can we respond through MyChart? Yes  Agent: Please be advised that Rx refills may take up to 3 business days. We ask that you follow-up with your pharmacy.

## 2024-03-31 ENCOUNTER — Other Ambulatory Visit: Payer: Self-pay

## 2024-03-31 DIAGNOSIS — N138 Other obstructive and reflux uropathy: Secondary | ICD-10-CM

## 2024-03-31 DIAGNOSIS — E291 Testicular hypofunction: Secondary | ICD-10-CM

## 2024-04-01 ENCOUNTER — Ambulatory Visit: Payer: Self-pay | Admitting: Urology

## 2024-04-01 LAB — HEMATOCRIT: Hematocrit: 47.9 % (ref 37.5–51.0)

## 2024-04-01 LAB — PSA: Prostate Specific Ag, Serum: 0.8 ng/mL (ref 0.0–4.0)

## 2024-04-01 LAB — HEMOGLOBIN: Hemoglobin: 15.8 g/dL (ref 13.0–17.7)

## 2024-04-01 LAB — TESTOSTERONE: Testosterone: 370 ng/dL (ref 264–916)

## 2024-06-06 ENCOUNTER — Other Ambulatory Visit: Payer: Self-pay | Admitting: Nurse Practitioner

## 2024-08-29 ENCOUNTER — Encounter: Payer: Self-pay | Admitting: Nurse Practitioner

## 2024-08-29 ENCOUNTER — Ambulatory Visit (INDEPENDENT_AMBULATORY_CARE_PROVIDER_SITE_OTHER): Payer: BC Managed Care – PPO | Admitting: Nurse Practitioner

## 2024-08-29 VITALS — BP 128/82 | HR 71 | Temp 98.2°F | Ht 75.0 in | Wt 292.4 lb

## 2024-08-29 DIAGNOSIS — N529 Male erectile dysfunction, unspecified: Secondary | ICD-10-CM | POA: Diagnosis not present

## 2024-08-29 DIAGNOSIS — Z125 Encounter for screening for malignant neoplasm of prostate: Secondary | ICD-10-CM | POA: Diagnosis not present

## 2024-08-29 DIAGNOSIS — E782 Mixed hyperlipidemia: Secondary | ICD-10-CM

## 2024-08-29 DIAGNOSIS — I872 Venous insufficiency (chronic) (peripheral): Secondary | ICD-10-CM

## 2024-08-29 DIAGNOSIS — Z0000 Encounter for general adult medical examination without abnormal findings: Secondary | ICD-10-CM | POA: Diagnosis not present

## 2024-08-29 DIAGNOSIS — L299 Pruritus, unspecified: Secondary | ICD-10-CM

## 2024-08-29 DIAGNOSIS — R7303 Prediabetes: Secondary | ICD-10-CM | POA: Diagnosis not present

## 2024-08-29 DIAGNOSIS — Z1211 Encounter for screening for malignant neoplasm of colon: Secondary | ICD-10-CM

## 2024-08-29 DIAGNOSIS — Z8669 Personal history of other diseases of the nervous system and sense organs: Secondary | ICD-10-CM

## 2024-08-29 DIAGNOSIS — E291 Testicular hypofunction: Secondary | ICD-10-CM

## 2024-08-29 MED ORDER — HYDROXYZINE HCL 10 MG PO TABS: 10.0000 mg | ORAL_TABLET | Freq: Every day | ORAL | 0 refills | Status: AC | PRN

## 2024-08-29 MED ORDER — TRIAMCINOLONE ACETONIDE 0.025 % EX CREA: 1.0000 | TOPICAL_CREAM | Freq: Two times a day (BID) | CUTANEOUS | 0 refills | Status: AC

## 2024-08-29 NOTE — Progress Notes (Signed)
 Established Patient Office Visit  Subjective   Patient ID: Robert Trevino, male    DOB: 05-09-1971  Age: 53 y.o. MRN: [id number]  Chief Complaint  Patient presents with   Annual Exam    Refill on kenalog  cream.     HPI  Varicose veins: Has been evaluated by vascular in the past will use steroid cream as needed for itching. States that when he is on his feet for a long time and he gets off them he will itch some and he will use the cream as needed. States that he will itch daily. States that after he sits down. He would use it once to twice a week and it would help.  Multiple arthralgias: Has been on Celebrex  200 mg twice daily which is effective  Hypogonadism: Was followed by urology patient is on 150 mg IM every 2 weeks  History of sleep apnea: states that it has been years since he has had one done. States that it was severe and he used a CPAP. States that he did the mask and could not tolerate it. States that he has tried nasal pillows that does not help.   for complete physical and follow up of chronic conditions.  Immunizations: -Tetanus: Completed in 2024 -Influenza: refused  -Shingles: refused  -Pneumonia: refused   Diet: Fair diet. He is eating 3 meals and he does snack. He is drinking coffee in the am and he does water and maybe an energy drink. He is working on cutting them back  Exercise: No regular exercise. States that he iwll try to. 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 Wears glasses  Dental exam: Completes semi-annually    Colonoscopy: Completed in cologuard  Lung Cancer Screening: Completed in   PSA: Due  Sleep: going to bed around 1030-11 and then he will get up around 5. Sometimes he feels rested and sometimes not.        Review of Systems  Constitutional:  Negative for chills and fever.  Respiratory:  Negative for shortness of breath.   Cardiovascular:  Negative for chest pain and leg  swelling.  Gastrointestinal:  Negative for abdominal pain, blood in stool, constipation, diarrhea, nausea and vomiting.       BM daily   Genitourinary:  Negative for dysuria and hematuria.  Neurological:  Negative for tingling and headaches.  Psychiatric/Behavioral:  Negative for hallucinations and suicidal ideas.       Objective:     BP 128/82   Pulse 71   Temp 98.2 F (36.8 C) (Oral)   Ht 6' 3 (1.905 m)   Wt 292 lb 6.4 oz (132.6 kg)   SpO2 93%   BMI 36.55 kg/m  BP Readings from Last 3 Encounters:  08/29/24 128/82  09/30/23 120/79  08/27/23 132/80   Wt Readings from Last 3 Encounters:  08/29/24 292 lb 6.4 oz (132.6 kg)  08/27/23 292 lb 6 oz (132.6 kg)  02/16/23 (!) 303 lb 12.8 oz (137.8 kg)   SpO2 Readings from Last 3 Encounters:  08/29/24 93%  08/27/23 95%  02/16/23 95%      Physical Exam Vitals and nursing note reviewed.  Constitutional:      Appearance: Normal appearance.  HENT:     Right Ear: Tympanic membrane, ear canal and external ear normal.     Left Ear: Tympanic membrane, ear canal and external ear normal.     Mouth/Throat:  Mouth: Mucous membranes are moist.     Pharynx: Oropharynx is clear.  Eyes:     Extraocular Movements: Extraocular movements intact.     Pupils: Pupils are equal, round, and reactive to light.  Cardiovascular:     Rate and Rhythm: Normal rate and regular rhythm.     Pulses: Normal pulses.     Heart sounds: Normal heart sounds.  Pulmonary:     Effort: Pulmonary effort is normal.     Breath sounds: Normal breath sounds.  Abdominal:     General: Bowel sounds are normal. There is no distension.     Palpations: There is no mass.     Tenderness: There is no abdominal tenderness.     Hernia: No hernia is present.  Musculoskeletal:     Right lower leg: No edema.     Left lower leg: No edema.  Lymphadenopathy:     Cervical: No cervical adenopathy.  Skin:    General: Skin is warm.  Neurological:     General: No focal  deficit present.     Mental Status: He is alert.     Deep Tendon Reflexes:     Reflex Scores:      Bicep reflexes are 2+ on the right side and 2+ on the left side.      Patellar reflexes are 2+ on the right side and 2+ on the left side.    Comments: Bilateral upper and lower extremity strength 5/5  Psychiatric:        Mood and Affect: Mood normal.        Behavior: Behavior normal.        Thought Content: Thought content normal.        Judgment: Judgment normal.      No results found for any visits on 08/29/24.    The 10-year ASCVD risk score (Arnett DK, et al., 2019) is: 3.6%    Assessment & Plan:   Problem List Items Addressed This Visit       Cardiovascular and Mediastinum   Venous stasis dermatitis of both lower extremities   History of same has been evaluated in 2023 by vascular.  Will refer patient to vascular for second opinion.  Does not use compression socks all the time.  Does use triamcinolone  sparingly only.  We will try hydroxyzine 10 mg nightly as needed to cut down on the use of topical steroids      Relevant Medications   triamcinolone  (KENALOG ) 0.025 % cream   Other Relevant Orders   Ambulatory referral to Vascular Surgery     Endocrine   Hypogonadism in male   History of the same currently followed by urology for testosterone  replacement.        Musculoskeletal and Integument   Pruritus   Secondary to venous stasis dermatitis likely.  Try hydroxyzine 10 mg daily as needed sedation precautions reviewed      Relevant Medications   hydrOXYzine (ATARAX) 10 MG tablet     Other   Erectile dysfunction of organic origin   Followed by urology on testosterone  replacement.      Mixed hyperlipidemia   History of the same pending lipid panel today      Relevant Orders   Lipid panel   Preventative health care - Primary   Discussed age-appropriate immunizations and screening exams.  Did review patient's personal, surgical, social, family histories.   Patient is up-to-date on all age-appropriate vaccinations he would like.  Patient declined flu, shingles, pneumonia vaccine today.  Cologuard ordered for CRC screening.  PSA ordered for prostate cancer screening.  Patient was given information at discharge about preventative healthcare maintenance with anticipatory guidance.      Relevant Orders   Comprehensive metabolic panel with GFR   CBC with Differential/Platelet   TSH   Prediabetes   History of the same.  Patient's weight is stable pending A1c      Relevant Orders   Hemoglobin A1c   History of sleep apnea   Per patient report history of severe sleep apnea.  Has been intolerant to CPAP in the past.  Curious about Zepbound for OSA.  No sleep study on file.  Will do at home sleep study.  Pending result      Relevant Orders   Ambulatory referral to Sleep Studies   Other Visit Diagnoses       Screening for prostate cancer       Relevant Orders   PSA     Screening for colon cancer       Relevant Orders   Cologuard     Venous insufficiency       Relevant Orders   Ambulatory referral to Vascular Surgery       Return in about 1 year (around 08/29/2025) for CPE and Labs.    Adina Crandall, NP

## 2024-08-29 NOTE — Assessment & Plan Note (Signed)
 Per patient report history of severe sleep apnea.  Has been intolerant to CPAP in the past.  Curious about Zepbound for OSA.  No sleep study on file.  Will do at home sleep study.  Pending result

## 2024-08-29 NOTE — Assessment & Plan Note (Signed)
 Discussed age-appropriate immunizations and screening exams.  Did review patient's personal, surgical, social, family histories.  Patient is up-to-date on all age-appropriate vaccinations he would like.  Patient declined flu, shingles, pneumonia vaccine today.  Cologuard ordered for CRC screening.  PSA ordered for prostate cancer screening.  Patient was given information at discharge about preventative healthcare maintenance with anticipatory guidance.

## 2024-08-29 NOTE — Assessment & Plan Note (Signed)
 History of the same pending lipid panel today

## 2024-08-29 NOTE — Assessment & Plan Note (Signed)
 Secondary to venous stasis dermatitis likely.  Try hydroxyzine 10 mg daily as needed sedation precautions reviewed

## 2024-08-29 NOTE — Assessment & Plan Note (Signed)
-   Followed by urology on testosterone replacement

## 2024-08-29 NOTE — Assessment & Plan Note (Signed)
 History of the same currently followed by urology for testosterone  replacement.

## 2024-08-29 NOTE — Assessment & Plan Note (Signed)
 History of same has been evaluated in 2023 by vascular.  Will refer patient to vascular for second opinion.  Does not use compression socks all the time.  Does use triamcinolone  sparingly only.  We will try hydroxyzine 10 mg nightly as needed to cut down on the use of topical steroids

## 2024-08-29 NOTE — Assessment & Plan Note (Signed)
 History of the same.  Patient's weight is stable pending A1c

## 2024-08-29 NOTE — Patient Instructions (Signed)
Nice to see you today I will be in touch with the labs once I have the results Follow up with me in 1 year, sooner if you need me  

## 2024-08-30 NOTE — Telephone Encounter (Signed)
 error

## 2024-09-01 ENCOUNTER — Other Ambulatory Visit: Payer: Self-pay | Admitting: Urology

## 2024-09-01 DIAGNOSIS — E349 Endocrine disorder, unspecified: Secondary | ICD-10-CM

## 2024-09-07 ENCOUNTER — Ambulatory Visit: Payer: Self-pay | Admitting: Nurse Practitioner

## 2024-09-07 LAB — CBC WITH DIFFERENTIAL/PLATELET
Basophils Absolute: 0.1 x10E3/uL (ref 0.0–0.2)
Basos: 1 %
EOS (ABSOLUTE): 0.2 x10E3/uL (ref 0.0–0.4)
Eos: 3 %
Hematocrit: 45.4 % (ref 37.5–51.0)
Hemoglobin: 14.9 g/dL (ref 13.0–17.7)
Immature Grans (Abs): 0 x10E3/uL (ref 0.0–0.1)
Immature Granulocytes: 0 %
Lymphocytes Absolute: 1 x10E3/uL (ref 0.7–3.1)
Lymphs: 14 %
MCH: 31.1 pg (ref 26.6–33.0)
MCHC: 32.8 g/dL (ref 31.5–35.7)
MCV: 95 fL (ref 79–97)
Monocytes Absolute: 0.8 x10E3/uL (ref 0.1–0.9)
Monocytes: 12 %
Neutrophils Absolute: 4.7 x10E3/uL (ref 1.4–7.0)
Neutrophils: 69 %
Platelets: 316 x10E3/uL (ref 150–450)
RBC: 4.79 x10E6/uL (ref 4.14–5.80)
RDW: 12.2 % (ref 11.6–15.4)
WBC: 6.9 x10E3/uL (ref 3.4–10.8)

## 2024-09-07 LAB — COMPREHENSIVE METABOLIC PANEL WITH GFR
ALT: 18 IU/L (ref 0–44)
AST: 24 IU/L (ref 0–40)
Albumin: 4.2 g/dL (ref 3.8–4.9)
Alkaline Phosphatase: 93 IU/L (ref 47–123)
BUN/Creatinine Ratio: 12 (ref 9–20)
BUN: 11 mg/dL (ref 6–24)
Bilirubin Total: 0.6 mg/dL (ref 0.0–1.2)
CO2: 21 mmol/L (ref 20–29)
Calcium: 8.9 mg/dL (ref 8.7–10.2)
Chloride: 104 mmol/L (ref 96–106)
Creatinine, Ser: 0.89 mg/dL (ref 0.76–1.27)
Globulin, Total: 2.4 g/dL (ref 1.5–4.5)
Glucose: 102 mg/dL — ABNORMAL HIGH (ref 70–99)
Potassium: 4.7 mmol/L (ref 3.5–5.2)
Sodium: 139 mmol/L (ref 134–144)
Total Protein: 6.6 g/dL (ref 6.0–8.5)
eGFR: 104 mL/min/1.73 (ref >59)

## 2024-09-07 LAB — HEMOGLOBIN A1C
Est. average glucose Bld gHb Est-mCnc: 114 mg/dL
Hgb A1c MFr Bld: 5.6 % (ref 4.8–5.6)

## 2024-09-07 LAB — LIPID PANEL
Chol/HDL Ratio: 4.5 ratio (ref 0.0–5.0)
Cholesterol, Total: 159 mg/dL (ref 100–199)
HDL: 35 mg/dL — ABNORMAL LOW (ref >39)
LDL Chol Calc (NIH): 108 mg/dL — ABNORMAL HIGH (ref 0–99)
Triglycerides: 86 mg/dL (ref 0–149)
VLDL Cholesterol Cal: 16 mg/dL (ref 5–40)

## 2024-09-07 LAB — PSA: Prostate Specific Ag, Serum: 0.7 ng/mL (ref 0.0–4.0)

## 2024-09-07 LAB — TSH: TSH: 1.59 u[IU]/mL (ref 0.450–4.500)

## 2024-09-15 ENCOUNTER — Telehealth: Payer: Self-pay | Admitting: Nurse Practitioner

## 2024-09-15 LAB — COLOGUARD: COLOGUARD: NEGATIVE

## 2024-09-15 NOTE — Telephone Encounter (Signed)
 Left detailed voicemail for patient to call the office back.

## 2024-09-15 NOTE — Telephone Encounter (Signed)
-----   Message from [facility] sent at 08/29/2024  4:26 PM EDT ----- Regarding: Pruritis Call and see if hydroxyzine is helping

## 2024-09-15 NOTE — Telephone Encounter (Signed)
 Call and see if hydroxyzine is helping

## 2024-09-28 ENCOUNTER — Other Ambulatory Visit: Payer: Self-pay

## 2024-09-28 DIAGNOSIS — E291 Testicular hypofunction: Secondary | ICD-10-CM

## 2024-09-28 DIAGNOSIS — N529 Male erectile dysfunction, unspecified: Secondary | ICD-10-CM

## 2024-09-28 DIAGNOSIS — N138 Other obstructive and reflux uropathy: Secondary | ICD-10-CM

## 2024-09-28 DIAGNOSIS — E349 Endocrine disorder, unspecified: Secondary | ICD-10-CM

## 2024-09-29 ENCOUNTER — Other Ambulatory Visit

## 2024-09-29 DIAGNOSIS — E349 Endocrine disorder, unspecified: Secondary | ICD-10-CM

## 2024-09-29 DIAGNOSIS — N138 Other obstructive and reflux uropathy: Secondary | ICD-10-CM

## 2024-09-29 DIAGNOSIS — E291 Testicular hypofunction: Secondary | ICD-10-CM

## 2024-09-29 DIAGNOSIS — N529 Male erectile dysfunction, unspecified: Secondary | ICD-10-CM

## 2024-09-30 LAB — PSA: Prostate Specific Ag, Serum: 0.7 ng/mL (ref 0.0–4.0)

## 2024-09-30 LAB — HEMOGLOBIN AND HEMATOCRIT, BLOOD
Hematocrit: 46.1 % (ref 37.5–51.0)
Hemoglobin: 15.1 g/dL (ref 13.0–17.7)

## 2024-09-30 LAB — TESTOSTERONE: Testosterone: 544 ng/dL (ref 264–916)

## 2024-10-04 NOTE — Progress Notes (Signed)
 10/07/24 9:38 PM   [name] Mar 10, 1971 [id number]  Referring provider:  Wendee Lynwood HERO, NP [address] [place],  [place] [address]  Urological history: 1.  Hypogonadism -contributing factors of age and obesity -testosterone  level (09/2024) 544 -hemoglobin/hematocrit (09/2024) 15.1/46.1 -testosterone  cypionate 200 mg/mL, 0.75 cc every 2 weeks (on Sunday)   2. ED -Contributing factors of age, BPH, testosterone  deficiency, and sleep apnea (for which he sleeps with a CPAP machine) -Managed with tadalafil  5 mg, on-demand dosing   3. BPH with LU TS -PSA (08/2024) 0.7   HPI: Robert Trevino is a 53 y.o.male who presents today for follow-up with blood work,  IPSS and SHIM.   Previous records reviewed.   He reports good adherence to testosterone  200 mg/milliliters, 0.75 cc every 2 weeks.  Denies new complaints of low libido, erectile dysfunction, fatigue, or mood changes.  No complaints of gynecomastia, visual changes, or thromboembolic symptoms.  Energy level, libido and overall sense of wellbeing being reported as stable/ improved compared to prior visit.    Testosterone  level 544  Hemoglobin/hematocrit 15.1/46.1  Liver enzymes (08/2024) normal   I PSS 5/1  He reports sensation of incomplete bladder emptying, urinary frequency, urinary intermittency, a weak urinary stream, nocturia x 1.  Patient denies any modifying or aggravating factors.  Patient denies any recent UTI's, gross hematuria, dysuria or suprapubic/flank pain.  Patient denies any fevers, chills, nausea or vomiting.    PSA 0.7  Serum creatinine (08/2024) 0.89, 104  Hemoglobin A1c (08/2024) 5.6  SHIM   He does not have confidence that he could get and keep an erection and his erections are not firm enough for penetrative intercourse.  Patient still having spontaneous erections.  He denies any pain or curvature with erections.  He is having no issues with ejaculation.  Testosterone  level  544  Cholesterol (08/2024) lower HDL, elevated LDL  TSH (08/2024) 1.590   PMH: Past Medical History:  Diagnosis Date   Family history of allergies    History of arthritis    Low testosterone     Sleep apnea     Surgical History: Past Surgical History:  Procedure Laterality Date   NO PAST SURGERIES     verified with patient 10/22/17    Home Medications:  Allergies as of 10/06/2024   No Known Allergies      Medication List        Accurate as of October 06, 2024 11:59 PM. If you have any questions, ask your nurse or doctor.          2-3CC SYRINGE 3 ML Misc 1 mg by Does not apply route every 14 (fourteen) days. Started by: [name]   BD Disp Needles 18G X 1-1/2 Misc Generic drug: NEEDLE (DISP) 18 G 1 mg by Does not apply route every 14 (fourteen) days. Started by: [name]   BD SafetyGlide Shielded Needle 22G X 1-1/2 5 ML Misc Generic drug: SYRINGE-NEEDLE (DISP) 5 ML 1 mg by Does not apply route every 14 (fourteen) days. Started by: [name]   celecoxib  200 MG capsule Commonly known as: CELEBREX  TAKE 1 CAPSULE BY MOUTH 2 TIMES A DAY AS NEEDED   fluticasone 50 MCG/ACT nasal spray Commonly known as: FLONASE Place 2 sprays into both nostrils as needed.   hydrOXYzine  10 MG tablet Commonly known as: ATARAX  Take 1 tablet (10 mg total) by mouth daily as needed.   testosterone  cypionate 200 MG/ML injection Commonly known as: DEPOTESTOSTERONE CYPIONATE INJECT 0.75  ML INTO THE MUSCLE EVERY 2 WEEKS. DISCARD REMAINDER OF VIAL   triamcinolone  0.025 % cream Commonly known as: KENALOG  Apply 1 Application topically 2 (two) times daily.        Allergies:  No Known Allergies  Family History: Family History  Problem Relation Age of Onset   Early death Mother    Diabetes Mother    Cancer Mother        leukemia   Stroke Father    Healthy Daughter    Kidney disease Neg Hx    Prostate cancer Neg Hx    Kidney cancer Neg Hx     Bladder Cancer Neg Hx     Social History:  reports that he has never smoked. He has never used smokeless tobacco. He reports current alcohol use. He reports that he does not use drugs.   Physical Exam: BP 137/81   Pulse 68   Ht 6' 4 (1.93 m)   Wt 295 lb (133.8 kg)   BMI 35.91 kg/m   Constitutional:  Well nourished. Alert and oriented, No acute distress. HEENT: [place] AT, moist mucus membranes.  Trachea midline Cardiovascular: No clubbing, cyanosis, or edema. Respiratory: Normal respiratory effort, no increased work of breathing. Neurologic: Grossly intact, no focal deficits, moving all 4 extremities. Psychiatric: Normal mood and affect.   Laboratory Data: See Epic and HPI    I have reviewed the labs.   Pertinent Imaging: N/A  Assessment & Plan:    1. Hypogonadism -Testosterone  levels within therapeutic limits -Hemoglobin/hematocrit levels normal -continue testosterone  cypionate 200 mg/ml, 0.75 cc IM every two weeks  2. Erectile dysfunction  -mild  -no bothersome symptoms  3. BPH with LUTS -PSA stable  -continue conservative management, avoiding bladder irritants and timed voiding's  Return in about 6 months (around 04/05/2025) for  testosterone  (one week after injection) H & H - only .   [name]   [facility] [address] [place], [place] [address] [phone]

## 2024-10-06 ENCOUNTER — Ambulatory Visit: Admitting: Urology

## 2024-10-06 ENCOUNTER — Encounter: Payer: Self-pay | Admitting: Urology

## 2024-10-06 VITALS — BP 137/81 | HR 68 | Ht 76.0 in | Wt 295.0 lb

## 2024-10-06 DIAGNOSIS — N401 Enlarged prostate with lower urinary tract symptoms: Secondary | ICD-10-CM | POA: Diagnosis not present

## 2024-10-06 DIAGNOSIS — E349 Endocrine disorder, unspecified: Secondary | ICD-10-CM

## 2024-10-06 DIAGNOSIS — N529 Male erectile dysfunction, unspecified: Secondary | ICD-10-CM | POA: Diagnosis not present

## 2024-10-06 DIAGNOSIS — E291 Testicular hypofunction: Secondary | ICD-10-CM

## 2024-10-06 DIAGNOSIS — N138 Other obstructive and reflux uropathy: Secondary | ICD-10-CM

## 2024-10-06 MED ORDER — BD DISP NEEDLES 18G X 1-1/2" MISC: 1.0000 mg | 0 refills | Status: AC

## 2024-10-06 MED ORDER — BD SAFETYGLIDE SHIELDED NEEDLE 22G X 1-1/2" 5 ML MISC
1.0000 mg | 0 refills | Status: AC
Start: 2024-10-06 — End: ?

## 2024-10-06 MED ORDER — SYRINGE 2-3 ML 3 ML MISC: 1.0000 mg | 3 refills | Status: AC

## 2024-10-06 MED ORDER — TESTOSTERONE CYPIONATE 200 MG/ML IM SOLN: INTRAMUSCULAR | 0 refills | Status: AC

## 2024-11-22 ENCOUNTER — Encounter (INDEPENDENT_AMBULATORY_CARE_PROVIDER_SITE_OTHER): Payer: Self-pay | Admitting: Vascular Surgery

## 2025-04-06 ENCOUNTER — Other Ambulatory Visit

## 2025-08-30 ENCOUNTER — Encounter: Admitting: Nurse Practitioner

## 2025-10-05 ENCOUNTER — Other Ambulatory Visit

## 2025-10-11 ENCOUNTER — Ambulatory Visit: Admitting: Urology
